# Patient Record
Sex: Female | Born: 1975 | Race: Black or African American | Hispanic: No | Marital: Married | Smoking: Never smoker
Health system: Southern US, Community
[De-identification: ages and names within clinical notes are randomized; demographics above are authoritative.]

## PROBLEM LIST (undated history)

## (undated) DIAGNOSIS — M5020 Other cervical disc displacement, unspecified cervical region: Secondary | ICD-10-CM

## (undated) DIAGNOSIS — I10 Essential (primary) hypertension: Secondary | ICD-10-CM

## (undated) DIAGNOSIS — H05013 Cellulitis of bilateral orbits: Secondary | ICD-10-CM

## (undated) HISTORY — PX: TUBAL LIGATION: SHX77

## (undated) HISTORY — DX: Cellulitis of bilateral orbits: H05.013

## (undated) HISTORY — DX: Other cervical disc displacement, unspecified cervical region: M50.20

---

## 2005-07-23 DIAGNOSIS — I82811 Embolism and thrombosis of superficial veins of right lower extremities: Secondary | ICD-10-CM

## 2005-07-23 HISTORY — DX: Embolism and thrombosis of superficial veins of right lower extremity: I82.811

## 2019-07-24 DIAGNOSIS — U071 COVID-19: Secondary | ICD-10-CM

## 2019-07-24 HISTORY — DX: COVID-19: U07.1

## 2020-01-21 ENCOUNTER — Other Ambulatory Visit: Payer: Self-pay | Admitting: Podiatry

## 2020-01-21 ENCOUNTER — Ambulatory Visit (INDEPENDENT_AMBULATORY_CARE_PROVIDER_SITE_OTHER): Payer: Commercial Managed Care - PPO

## 2020-01-21 ENCOUNTER — Ambulatory Visit (INDEPENDENT_AMBULATORY_CARE_PROVIDER_SITE_OTHER): Payer: Commercial Managed Care - PPO | Admitting: Podiatry

## 2020-01-21 ENCOUNTER — Encounter: Payer: Self-pay | Admitting: Podiatry

## 2020-01-21 ENCOUNTER — Other Ambulatory Visit: Payer: Self-pay

## 2020-01-21 VITALS — Temp 97.9°F

## 2020-01-21 DIAGNOSIS — M76822 Posterior tibial tendinitis, left leg: Secondary | ICD-10-CM

## 2020-01-21 DIAGNOSIS — M79672 Pain in left foot: Secondary | ICD-10-CM

## 2020-01-21 DIAGNOSIS — M25572 Pain in left ankle and joints of left foot: Secondary | ICD-10-CM | POA: Diagnosis not present

## 2020-01-21 MED ORDER — DICLOFENAC SODIUM 75 MG PO TBEC: 75.0000 mg | DELAYED_RELEASE_TABLET | Freq: Two times a day (BID) | ORAL | 2 refills | Status: DC

## 2020-01-21 NOTE — Progress Notes (Signed)
   Subjective:    Patient ID: Kathryn Duncan, female    DOB: [birth year], 44 y.o.   MRN: [id number]  HPI    Review of Systems  All other systems reviewed and are negative.      Objective:   Physical Exam        Assessment & Plan:

## 2020-01-22 NOTE — Progress Notes (Signed)
Subjective:   Patient ID: Kathryn Duncan, female   DOB: 44 y.o.   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  States is been going on for around 1 year does not remember injury   Review of Systems  All other systems reviewed and are negative.       Objective:  Physical Exam Vitals and nursing note reviewed.  Constitutional:      Appearance: She is well-developed.  Pulmonary:     Effort: Pulmonary effort is normal.  Musculoskeletal:        General: Normal range of motion.  Skin:    General: Skin is warm.  Neurological:     Mental Status: She is alert.     Neurovascular status intact muscle strength found to be adequate range of motion within normal limits.  Patient is noted to have discomfort on the inside of the left ankle with inflammation fluid buildup around the posterior tib tendon as it inserts navicular with no current indications of rupture or swelling of the tendon itself.  Local dorsal pain with no significant changes but may be due to compensation and gait.  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  Advised on ice therapy supportive shoe if cannot wear the boot and will be seen back again in 3 weeks or earlier if necessary  X-rays indicate there is moderate flattening of the arch and patient would benefit from orthotic treatment

## 2020-02-11 ENCOUNTER — Other Ambulatory Visit: Payer: Self-pay

## 2020-02-11 ENCOUNTER — Ambulatory Visit: Payer: Commercial Managed Care - PPO | Admitting: Podiatry

## 2020-02-11 ENCOUNTER — Encounter: Payer: Self-pay | Admitting: Podiatry

## 2020-02-11 VITALS — Temp 97.2°F

## 2020-02-11 DIAGNOSIS — M76822 Posterior tibial tendinitis, left leg: Secondary | ICD-10-CM

## 2020-02-11 DIAGNOSIS — M76821 Posterior tibial tendinitis, right leg: Secondary | ICD-10-CM | POA: Diagnosis not present

## 2020-02-15 NOTE — Progress Notes (Signed)
Subjective:   Patient ID: Kathryn Duncan, female   DOB: 44 y.o.   MRN: [id number]   HPI Patient presents stating she is still getting pain in her left ankle and states that she feels like she needs more support.  Patient has had some improvement in oral medication is helping   ROS      Objective:  Physical Exam  Inflammation pain of the posterior tibial insertion left with moderate depression of the arch but no indication of tendon damage or rupture     Assessment:  Chronic posterior tibial tendinitis left with structure of the foot part of the complicating factor     Plan:  H&P reviewed condition and at this point I have recommended orthotics to lift up the arch and try to take pressure off the foot.  Patient also will utilize aggressive ice therapy and continue with oral medications topical medicines.  Patient casted today for functional orthotic devices

## 2020-02-29 ENCOUNTER — Ambulatory Visit: Payer: Commercial Managed Care - PPO | Admitting: Orthotics

## 2020-02-29 ENCOUNTER — Other Ambulatory Visit: Payer: Self-pay

## 2020-02-29 DIAGNOSIS — M25572 Pain in left ankle and joints of left foot: Secondary | ICD-10-CM

## 2020-02-29 DIAGNOSIS — M76822 Posterior tibial tendinitis, left leg: Secondary | ICD-10-CM

## 2020-02-29 NOTE — Progress Notes (Signed)
Patient came in today to pick up custom made foot orthotics.  The goals were accomplished and the patient reported no dissatisfaction with said orthotics.  Patient was advised of breakin period and how to report any issues. 

## 2020-03-01 ENCOUNTER — Encounter: Payer: Commercial Managed Care - PPO | Admitting: Orthotics

## 2020-04-14 ENCOUNTER — Telehealth: Payer: Self-pay | Admitting: *Deleted

## 2020-04-14 ENCOUNTER — Other Ambulatory Visit: Payer: Self-pay | Admitting: Podiatry

## 2020-04-14 MED ORDER — METHYLPREDNISOLONE 4 MG PO TBPK: ORAL_TABLET | ORAL | 0 refills | Status: DC

## 2020-04-14 NOTE — Telephone Encounter (Signed)
Rx sent to pharmacy for steroid.  Cced to [employer]

## 2020-04-14 NOTE — Progress Notes (Unsigned)
Rx Medrol pack. Pt to d/c Voltaren

## 2020-04-14 NOTE — Telephone Encounter (Signed)
Patient called requesting prescription - she states she doesn't have an appointment with Dr. Paulla Dolly for another 2 weeks and would like something to help with her foot and ankle pain. "I am having pitting edema +3 and a lot of pain"  Please advise

## 2020-04-25 ENCOUNTER — Other Ambulatory Visit: Payer: Self-pay

## 2020-04-25 ENCOUNTER — Encounter: Payer: Self-pay | Admitting: Podiatry

## 2020-04-25 ENCOUNTER — Ambulatory Visit (INDEPENDENT_AMBULATORY_CARE_PROVIDER_SITE_OTHER): Payer: Commercial Managed Care - PPO | Admitting: Podiatry

## 2020-04-25 DIAGNOSIS — M76822 Posterior tibial tendinitis, left leg: Secondary | ICD-10-CM

## 2020-04-25 DIAGNOSIS — T148XXA Other injury of unspecified body region, initial encounter: Secondary | ICD-10-CM

## 2020-04-27 ENCOUNTER — Other Ambulatory Visit: Payer: Self-pay | Admitting: Podiatry

## 2020-04-28 NOTE — Progress Notes (Signed)
Subjective:   Patient ID: Kathryn Duncan, female   DOB: 44 y.o.   MRN: [id number]   HPI Patient states she seems to be gradually improving but she still feels like she needs a period of rest to try to get over the top and does seem to do better when she can wear her boot   ROS      Objective:  Physical Exam  Neurovascular status intact with diminishment of discomfort in the left foot with pain still present upon deep palpation but better than it was previously the patient continues to experience discomfort with ambulation     Assessment:  Continues to experience discomfort consistent with fasciitis tendinitis-like condition     Plan:  I reviewed the gradual increase in work at this time and patient may take this week off in order to rest but then will return to work with boot for the next several weeks.  I discussed continuation of stretching exercises shoe gear modification and if symptoms persist return will be seen back.  Most likely had some kind of a traumatic bruise on the left foot but it appears to be resolving currently

## 2020-05-01 ENCOUNTER — Ambulatory Visit
Admission: RE | Admit: 2020-05-01 | Discharge: 2020-05-01 | Disposition: A | Discharge status: Home / Self Care | Payer: Commercial Managed Care - PPO | Source: Ambulatory Visit | Attending: Podiatry | Admitting: Podiatry

## 2020-05-01 DIAGNOSIS — T148XXA Other injury of unspecified body region, initial encounter: Secondary | ICD-10-CM

## 2020-05-01 MED ORDER — GADOBENATE DIMEGLUMINE 529 MG/ML IV SOLN
20.0000 mL | Freq: Once | INTRAVENOUS | Status: AC | PRN
  Administered 2020-05-01: 20 mL via INTRAVENOUS

## 2020-05-06 ENCOUNTER — Other Ambulatory Visit: Payer: Self-pay

## 2020-05-06 ENCOUNTER — Ambulatory Visit: Payer: Commercial Managed Care - PPO | Admitting: Podiatry

## 2020-05-06 ENCOUNTER — Ambulatory Visit (INDEPENDENT_AMBULATORY_CARE_PROVIDER_SITE_OTHER): Payer: Commercial Managed Care - PPO

## 2020-05-06 ENCOUNTER — Encounter: Payer: Self-pay | Admitting: Podiatry

## 2020-05-06 DIAGNOSIS — T148XXA Other injury of unspecified body region, initial encounter: Secondary | ICD-10-CM

## 2020-05-06 DIAGNOSIS — M76822 Posterior tibial tendinitis, left leg: Secondary | ICD-10-CM | POA: Diagnosis not present

## 2020-05-10 NOTE — Progress Notes (Signed)
Subjective:   Patient ID: Kathryn Duncan, female   DOB: 44 y.o.   MRN: [id number]   HPI Patient presents stating she has been wearing her boot as best she can and states that when she wears it she seems to have less symptoms but she still is having pain on the inside of the left ankle   ROS      Objective:  Physical Exam  Neurovascular status intact with continued pain in the posterior tibial tendon left as it comes under the medial malleolus inserts into the navicular with moderate depression of the arch noted with no indication that the tendon is dysfunctional     Assessment:  Continue possibility that there is this chronic inflammation and there may be a subtle tear not picked up on MRI as far as the posterior tibial tendon goes with the possibility that this is the pathology     Plan:  H&P reviewed condition and I have recommended that at this point we try to wean her out of the boot and that if symptoms persist or get a need to consider surgical intervention with exploration of the posterior tibial tendon with possible Kidner procedure.  At this point I educated her on all of this I did x-ray her foot again to try to ascertain any other pathology that may be present  X-rays indicate that there is moderate depression of the arch with no indications of continued breakdown of the arch and I did not note accessory navicular currently signed this

## 2020-06-23 ENCOUNTER — Ambulatory Visit: Payer: Commercial Managed Care - PPO | Admitting: Podiatry

## 2020-06-30 ENCOUNTER — Other Ambulatory Visit: Payer: Self-pay

## 2020-06-30 ENCOUNTER — Ambulatory Visit: Payer: Commercial Managed Care - PPO | Admitting: Podiatry

## 2020-06-30 ENCOUNTER — Encounter: Payer: Self-pay | Admitting: Podiatry

## 2020-06-30 DIAGNOSIS — T148XXA Other injury of unspecified body region, initial encounter: Secondary | ICD-10-CM | POA: Diagnosis not present

## 2020-06-30 DIAGNOSIS — R600 Localized edema: Secondary | ICD-10-CM

## 2020-06-30 DIAGNOSIS — M76822 Posterior tibial tendinitis, left leg: Secondary | ICD-10-CM | POA: Diagnosis not present

## 2020-06-30 NOTE — Progress Notes (Signed)
Subjective:   Patient ID: Kathryn Duncan, female   DOB: 44 y.o.   MRN: [id number]   HPI Patient states that she does get pain but the pain seems to come and go and she is getting swelling in her ankle.  States that she has been reasonably active but still having problems   ROS      Objective:  Physical Exam  Neurovascular status intact with pain still around the posterior tibial tendon left but not intense with mild swelling around the area swelling more in the lateral side of the ankle nondescript with negative Bevelyn Buckles' sign noted     Assessment:  Difficult to say whether or not there may be a small tear posterior tibial tendon versus a synovitis-like condition moderate flatfoot deformity and swelling of the ankle     Plan:  H&P condition reviewed and at this point I reviewed the results of the MRI and discussed that it slightly ambiguous as to whether or not there is a tear but we will continue to treat clinically with orthotics possible injections with ultimate possible exploration of the area Kidner procedure.  At this point I also went ahead dispensed ankle compression stocking to try to see if we can take some of the pressure off the foot and I advised this patient on topical medicines oral medicines as needed and supportive shoe gear usage

## 2020-08-19 ENCOUNTER — Encounter: Payer: Self-pay | Admitting: Obstetrics and Gynecology

## 2020-08-19 ENCOUNTER — Ambulatory Visit: Payer: Commercial Managed Care - PPO | Admitting: Obstetrics and Gynecology

## 2020-08-19 ENCOUNTER — Other Ambulatory Visit (HOSPITAL_COMMUNITY)
Admission: RE | Admit: 2020-08-19 | Discharge: 2020-08-19 | Disposition: A | Discharge status: Home / Self Care | Payer: Commercial Managed Care - PPO | Source: Ambulatory Visit | Attending: Obstetrics and Gynecology | Admitting: Obstetrics and Gynecology

## 2020-08-19 ENCOUNTER — Other Ambulatory Visit: Payer: Self-pay

## 2020-08-19 VITALS — BP 144/80 | HR 104 | Resp 14 | Ht 66.0 in | Wt 248.0 lb

## 2020-08-19 DIAGNOSIS — R350 Frequency of micturition: Secondary | ICD-10-CM

## 2020-08-19 DIAGNOSIS — R3915 Urgency of urination: Secondary | ICD-10-CM

## 2020-08-19 DIAGNOSIS — N3946 Mixed incontinence: Secondary | ICD-10-CM

## 2020-08-19 DIAGNOSIS — N921 Excessive and frequent menstruation with irregular cycle: Secondary | ICD-10-CM

## 2020-08-19 DIAGNOSIS — Z01419 Encounter for gynecological examination (general) (routine) without abnormal findings: Secondary | ICD-10-CM | POA: Insufficient documentation

## 2020-08-19 NOTE — Progress Notes (Signed)
45 y.o. G48P4  Married [place] American female here for annual exam.    Bleeding heavy for 9 - 14 days with her cycle.  Is clotting.  Feeling weak and lightheaded sometimes.  Has low back pain with bleeding.  Ibuprofen sometimes works and sometimes not.  Midol does not help.   Considering hysterectomy.  Cannot take birth control due to a superficial leg thrombosis.  Not interested in an IUD.   Having bladder control problems.  Having to repeat her voids 30 min after she has already gone.  Difficulty getting to the bathroom on time.  Ditropan did not help and she had dry mouth.  Leaks with cough, laugh, sneeze, squatting.  Kegel's not helping.  Wearing a pad all the time.   Declines future childbearing.   Labs done 2 months ago, and she had low iron, vit D.  Normal thyroid function  Seeing orthopedist for bulging disc C7 and C8, tendinosis.   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   Urine dip - RBCs many, 0 - 5 WBC, 6 - 10 squams.   PCP:   Raechel Chute, MD  Patient's last menstrual period was 08/10/2020.     Period Duration (Days): 9-14 Period Pattern: (!) Irregular Menstrual Flow: Light,Moderate,Heavy Menstrual Control: Maxi pad Menstrual Control Change Freq (Hours): varies with flow, on heavy days 2 hours Dysmenorrhea: (!) Moderate Dysmenorrhea Symptoms: Other (Comment),Nausea (lower back pai, fatigue)     Sexually active: Yes.    The current method of family planning is tubal ligation.    Exercising: Yes.    The patient has a physically strenuous job, but has no regular exercise apart from work.  Smoker:  no  Health Maintenance: Pap:  ?2020 normal per patient History of abnormal Pap:  Yes, had colposcopy but no treatment.  MMG:  Fall 2020 normal per patient in [place], [place]:  UTD  Gardasil:   no HIV: screened during pregnancy  Hep C: never Screening Labs:  Hb today: PCP, Urine today: pending   reports that she has never smoked. She has never used  smokeless tobacco. She reports previous alcohol use. She reports that she does not use drugs.  Past Medical History:  Diagnosis Date  . Superficial thrombosis of leg, right 2007    Past Surgical History:  Procedure Laterality Date  . TUBAL LIGATION      Current Outpatient Medications  Medication Sig Dispense Refill  . Ascorbic Acid (VITAMIN C PO) Take 1 tablet by mouth daily.    . Cyanocobalamin (VITAMIN B 12 PO) Take 1 tablet by mouth daily.    Marland Kitchen gabapentin (NEURONTIN) 300 MG capsule Take 300 mg by mouth daily.    . meloxicam (MOBIC) 15 MG tablet Take 15 mg by mouth daily.    . Multiple Vitamins-Minerals (ZINC PO) Take 1 tablet by mouth daily.    . Vitamin D, Ergocalciferol, (DRISDOL) 1.25 MG (50000 UNIT) CAPS capsule Take 50,000 Units by mouth once a week.     No current facility-administered medications for this visit.    Family History  Problem Relation Age of Onset  . Breast cancer Mother   . Diabetes Mother   . Hypertension Mother   . Hyperlipidemia Mother   . Hypertension Father   . Hyperlipidemia Father   . Diabetes Father   . Heart disease Father   . Rheum arthritis Father   . Hypertension Sister   . Breast cancer Paternal Aunt     Review of Systems  Genitourinary: Positive for frequency and urgency.       Heavy, prolonged periods  All other systems reviewed and are negative.   Exam:   BP (!) 144/80 (BP Location: Left Arm, Patient Position: Sitting, Cuff Size: Large)   Pulse (!) 104   Resp 14   Ht 5\' 6"  (1.676 m)   Wt 248 lb (112.5 kg)   LMP 08/10/2020   BMI 40.03 kg/m     General appearance: alert, cooperative and appears stated age Head: normocephalic, without obvious abnormality, atraumatic Neck: no adenopathy, supple, symmetrical, trachea midline and thyroid normal to inspection and palpation Lungs: clear to auscultation bilaterally Breasts: normal appearance, no masses or tenderness, No nipple retraction or dimpling, No nipple discharge or  bleeding, No axillary adenopathy Heart: regular rate and rhythm Abdomen: soft, non-tender; no masses, no organomegaly Extremities: extremities normal, atraumatic, no cyanosis or edema Skin: skin color, texture, turgor normal. No rashes or lesions Lymph nodes: cervical, supraclavicular, and axillary nodes normal. Neurologic: grossly normal  Pelvic: External genitalia:  no lesions              No abnormal inguinal nodes palpated.              Urethra:  normal appearing urethra with no masses, tenderness or lesions              Bartholins and Skenes: normal                 Vagina: normal appearing vagina with normal color and discharge, no lesions              Cervix: no lesions              Pap taken: Yes.   Bimanual Exam:  Uterus:  normal size, contour, position, consistency, mobility, non-tender              Adnexa: no mass, fullness, tenderness              Rectal exam: Yes.  .  Confirms.              Anus:  normal sphincter tone, no lesions  Chaperone was present for exam.  Assessment:   Well woman visit with normal exam. Status post BTL. FH breast cancer. Hx superficial thrombus right leg post partum.   Mixed incontinence.  Menorrhagia with irregular bleeding.   Plan: Mammogram screening discussed.  She will schedule at facility in [place].  Self breast awareness reviewed. Pap and HR HPV as above. Guidelines for Calcium, Vitamin D, regular exercise program including cardiovascular and weight bearing exercise. Get copy of recent labs.  Return for pelvic ultrasound and EMB.  Procedures explained.  We discussed urinary incontinence - overactive bladder and stress incontinence.  I would recommend urodynamic testing if surgical treatment of stress incontinence is pursued.   Follow up annually and prn.

## 2020-08-19 NOTE — Patient Instructions (Addendum)
EXERCISE AND DIET:  We recommended that you start or continue a regular exercise program for good health. Regular exercise means any activity that makes your heart beat faster and makes you sweat.  We recommend exercising at least 30 minutes per day at least 3 days a week, preferably 4 or 5.  We also recommend a diet low in fat and sugar.  Inactivity, poor dietary choices and obesity can cause diabetes, heart attack, stroke, and kidney damage, among others.    ALCOHOL AND SMOKING:  Women should limit their alcohol intake to no more than 7 drinks/beers/glasses of wine (combined, not each!) per week. Moderation of alcohol intake to this level decreases your risk of breast cancer and liver damage. And of course, no recreational drugs are part of a healthy lifestyle.  And absolutely no smoking or even second hand smoke. Most people know smoking can cause heart and lung diseases, but did you know it also contributes to weakening of your bones? Aging of your skin?  Yellowing of your teeth and nails?  CALCIUM AND VITAMIN D:  Adequate intake of calcium and Vitamin D are recommended.  The recommendations for exact amounts of these supplements seem to change often, but generally speaking 600 mg of calcium (either carbonate or citrate) and 800 units of Vitamin D per day seems prudent. Certain women may benefit from higher intake of Vitamin D.  If you are among these women, your doctor will have told you during your visit.    PAP SMEARS:  Pap smears, to check for cervical cancer or precancers,  have traditionally been done yearly, although recent scientific advances have shown that most women can have pap smears less often.  However, every woman still should have a physical exam from her gynecologist every year. It will include a breast check, inspection of the vulva and vagina to check for abnormal growths or skin changes, a visual exam of the cervix, and then an exam to evaluate the size and shape of the uterus and  ovaries.  And after 43 years of age, a rectal exam is indicated to check for rectal cancers. We will also provide age appropriate advice regarding health maintenance, like when you should have certain vaccines, screening for sexually transmitted diseases, bone density testing, colonoscopy, mammograms, etc.   MAMMOGRAMS:  All women over 36 years old should have a yearly mammogram. Many facilities now offer a "3D" mammogram, which may cost around $50 extra out of pocket. If possible,  we recommend you accept the option to have the 3D mammogram performed.  It both reduces the number of women who will be called back for extra views which then turn out to be normal, and it is better than the routine mammogram at detecting truly abnormal areas.  You may do this at the [facility] or [place].   COLONOSCOPY:  Colonoscopy to screen for colon cancer is recommended for all women at age 26.  We know, you hate the idea of the prep.  We agree, BUT, having colon cancer and not knowing it is worse!!  Colon cancer so often starts as a polyp that can be seen and removed at colonscopy, which can quite literally save your life!  And if your first colonoscopy is normal and you have no family history of colon cancer, most women don't have to have it again for 10 years.  Once every ten years, you can do something that may end up saving your life, right?  We will be happy  to help you get it scheduled when you are ready.  Be sure to check your insurance coverage so you understand how much it will cost.  It may be covered as a preventative service at no cost, but you should check your particular policy.      Endometrial Biopsy  An endometrial biopsy is a procedure to remove tissue samples from the endometrium, which is the lining of the uterus. The tissue that is removed can then be checked under a microscope for disease. This procedure is used to diagnose conditions such as endometrial cancer, endometrial tuberculosis, polyps, or  other inflammatory conditions. This procedure may also be used to investigate uterine bleeding to determine where you are in your menstrual cycle or how your hormone levels are affecting the lining of the uterus. Tell a health care provider about:  Any allergies you have.  All medicines you are taking, including vitamins, herbs, eye drops, creams, and over-the-counter medicines.  Any problems you or family members have had with anesthetic medicines.  Any blood disorders you have.  Any surgeries you have had.  Any medical conditions you have.  Whether you are pregnant or may be pregnant. What are the risks? Generally, this is a safe procedure. However, problems may occur, including:  Bleeding.  Pelvic infection.  Puncture of the wall of the uterus with the biopsy device (rare).  Allergic reactions to medicines. What happens before the procedure?  Keep a record of your menstrual cycles as told by your health care provider. You may need to schedule your procedure for a specific time in your cycle.  You may want to bring a sanitary pad to wear after the procedure.  Plan to have someone take you home from the hospital or clinic.  Ask your health care provider about: ? Changing or stopping your regular medicines. This is especially important if you are taking diabetes medicines, arthritis medicines, or blood thinners. ? Taking medicines such as aspirin and ibuprofen. These medicines can thin your blood. Do not take these medicines unless your health care provider tells you to take them. ? Taking over-the-counter medicines, vitamins, herbs, and supplements. What happens during the procedure?  You will lie on an exam table with your feet and legs supported as in a pelvic exam.  Your health care provider will insert an instrument (speculum) into your vagina to see your cervix.  Your cervix will be cleansed with an antiseptic solution.  A medicine (local anesthetic) will be used  to numb the cervix.  A forceps instrument (tenaculum) will be used to hold your cervix steady for the biopsy.  A thin, rod-like instrument (uterine sound) will be inserted through your cervix to determine the length of your uterus and the location where the biopsy sample will be removed.  A thin, flexible tube (catheter) will be inserted through your cervix and into the uterus. The catheter will be used to collect the biopsy sample from your endometrial tissue.  The catheter and speculum will then be removed, and the tissue sample will be sent to a lab for examination. The procedure may vary among health care providers and hospitals. What can I expect after procedure?  You will rest in a recovery area until you are ready to go home.  You may have mild cramping and a small amount of vaginal bleeding. This is normal.  You may have a small amount of vaginal bleeding for a few days. This is normal.  It is up to you to get  the results of your procedure. Ask your health care provider, or the department that is doing the procedure, when your results will be ready. Follow these instructions at home:  Take over-the-counter and prescription medicines only as told by your health care provider.  Do not douche, use tampons, or have sexual intercourse until your health care provider approves.  Return to your normal activities as told by your health care provider. Ask your health care provider what activities are safe for you.  Follow instructions from your health care provider about any activity restrictions, such as restrictions on strenuous exercise or heavy lifting.  Keep all follow-up visits. This is important. Contact a health care provider:  You have heavy bleeding, or bleed for longer than 2 days after the procedure.  You have bad smelling discharge from your vagina.  You have a fever or chills.  You have a burning sensation when urinating or you have difficulty urinating.  You have  severe pain in your lower abdomen. Get help right away if you:  You have severe cramps in your stomach or back.  You pass large blood clots.  Your bleeding increases.  You become weak or light-headed, or you faint or lose consciousness. Summary  An endometrial biopsy is a procedure to remove tissue samples is taken from the endometrium, which is the lining of the uterus.  The tissue sample that is removed will be checked under a microscope for disease.  This procedure is used to diagnose conditions such as endometrial cancer, endometrial tuberculosis, polyps, or other inflammatory conditions.  After the procedure, it is common to have mild cramping and a small amount of vaginal bleeding for a few days.  Do not douche, use tampons, or have sexual intercourse until your health care provider approves. Ask your health care provider which activities are safe for you. This information is not intended to replace advice given to you by your health care provider. Make sure you discuss any questions you have with your health care provider. Document Revised: 02/01/2020 Document Reviewed: 02/01/2020 Elsevier Patient Education  2021 [place].  Urinary Incontinence  Urinary incontinence refers to a condition in which a person is unable to control where and when to pass urine. A person with this condition will urinate when he or she does not mean to (involuntarily). What are the causes? This condition may be caused by:  Medicines.  Infections.  Constipation.  Overactive bladder muscles.  Weak bladder muscles.  Weak pelvic floor muscles. These muscles provide support for the bladder, intestine, and, in women, the uterus.  Enlarged prostate in men. The prostate is a gland near the bladder. When it gets too big, it can pinch the urethra. With the urethra blocked, the bladder can weaken and lose the ability to empty properly.  Surgery.  Emotional factors, such as anxiety, stress, or  post-traumatic stress disorder (PTSD).  Pelvic organ prolapse. This happens in women when organs shift out of place and into the vagina. This shift can prevent the bladder and urethra from working properly. What increases the risk? The following factors may make you more likely to develop this condition:  Older age.  Obesity and physical inactivity.  Pregnancy and childbirth.  Menopause.  Diseases that affect the nerves or spinal cord (neurological diseases).  Long-term (chronic) coughing. This can increase pressure on the bladder and pelvic floor muscles. What are the signs or symptoms? Symptoms may vary depending on the type of urinary incontinence you have. They include:  A  sudden urge to urinate, but passing urine involuntarily before you can get to a bathroom (urge incontinence).  Suddenly passing urine with any activity that forces urine to pass, such as coughing, laughing, exercise, or sneezing (stress incontinence).  Needing to urinate often, but urinating only a small amount, or constantly dribbling urine (overflow incontinence).  Urinating because you cannot get to the bathroom in time due to a physical disability, such as arthritis or injury, or communication and thinking problems, such as Alzheimer disease (functional incontinence). How is this diagnosed? This condition may be diagnosed based on:  Your medical history.  A physical exam.  Tests, such as: ? Urine tests. ? X-rays of your kidney and bladder. ? Ultrasound. ? CT scan. ? Cystoscopy. In this procedure, a health care provider inserts a tube with a light and camera (cystoscope) through the urethra and into the bladder in order to check for problems. ? Urodynamic testing. These tests assess how well the bladder, urethra, and sphincter can store and release urine. There are different types of urodynamic tests, and they vary depending on what the test is measuring. To help diagnose your condition, your health  care provider may recommend that you keep a log of when you urinate and how much you urinate. How is this treated? Treatment for this condition depends on the type of incontinence that you have and its cause. Treatment may include:  Lifestyle changes, such as: ? Quitting smoking. ? Maintaining a healthy weight. ? Staying active. Try to get 150 minutes of moderate-intensity exercise every week. Ask your health care provider which activities are safe for you. ? Eating a healthy diet.  Avoid high-fat foods, like fried foods.  Avoid refined carbohydrates like white bread and white rice.  Limit how much alcohol and caffeine you drink.  Increase your fiber intake. Foods such as fresh fruits, vegetables, beans, and whole grains are healthy sources of fiber.  Pelvic floor muscle exercises.  Bladder training, such as lengthening the amount of time between bathroom breaks, or using the bathroom at regular intervals.  Using techniques to suppress bladder urges. This can include distraction techniques or controlled breathing exercises.  Medicines to relax the bladder muscles and prevent bladder spasms.  Medicines to help slow or prevent the growth of a man's prostate.  Botox injections. These can help relax the bladder muscles.  Using pulses of electricity to help change bladder reflexes (electrical nerve stimulation).  For women, using a medical device to prevent urine leaks. This is a small, tampon-like, disposable device that is inserted into the urethra.  Injecting collagen or carbon beads (bulking agents) into the urinary sphincter. These can help thicken tissue and close the bladder opening.  Surgery. Follow these instructions at home: Lifestyle  Limit alcohol and caffeine. These can fill your bladder quickly and irritate it.  Keep yourself clean to help prevent odors and skin damage. Ask your doctor about special skin creams and cleansers that can protect the skin from  urine.  Consider wearing pads or adult diapers. Make sure to change them regularly, and always change them right after experiencing incontinence. General instructions  Take over-the-counter and prescription medicines only as told by your health care provider.  Use the bathroom about every 3-4 hours, even if you do not feel the need to urinate. Try to empty your bladder completely every time. After urinating, wait a minute. Then try to urinate again.  Make sure you are in a relaxed position while urinating.  If your incontinence  is caused by nerve problems, keep a log of the medicines you take and the times you go to the bathroom.  Keep all follow-up visits as told by your health care provider. This is important. Contact a health care provider if:  You have pain that gets worse.  Your incontinence gets worse. Get help right away if:  You have a fever or chills.  You are unable to urinate.  You have redness in your groin area or down your legs. Summary  Urinary incontinence refers to a condition in which a person is unable to control where and when to pass urine.  This condition may be caused by medicines, infection, weak bladder muscles, weak pelvic floor muscles, enlargement of the prostate (in men), or surgery.  The following factors increase your risk for developing this condition: older age, obesity, pregnancy and childbirth, menopause, neurological diseases, and chronic coughing.  There are several types of urinary incontinence. They include urge incontinence, stress incontinence, overflow incontinence, and functional incontinence.  This condition is usually treated first with lifestyle and behavioral changes, such as quitting smoking, eating a healthier diet, and doing regular pelvic floor exercises. Other treatment options include medicines, bulking agents, medical devices, electrical nerve stimulation, or surgery. This information is not intended to replace advice given to  you by your health care provider. Make sure you discuss any questions you have with your health care provider. Document Revised: 07/19/2017 Document Reviewed: 10/18/2016 Elsevier Patient Education  [place].

## 2020-08-21 LAB — CULTURE INDICATED

## 2020-08-21 LAB — URINALYSIS, COMPLETE W/RFL CULTURE
Bilirubin Urine: NEGATIVE
Glucose, UA: NEGATIVE
Hyaline Cast: NONE SEEN /LPF
Ketones, ur: NEGATIVE
Leukocyte Esterase: NEGATIVE
Nitrites, Initial: NEGATIVE
RBC / HPF: >=60 /HPF — AB (ref 0–2)
Specific Gravity, Urine: 1.015 (ref 1.001–1.03)
pH: 6.5 (ref 5.0–8.0)

## 2020-08-21 LAB — URINE CULTURE
MICRO NUMBER:: 11469917
SPECIMEN QUALITY:: ADEQUATE

## 2020-08-22 LAB — CYTOLOGY - PAP
Comment: NEGATIVE
Diagnosis: NEGATIVE
High risk HPV: NEGATIVE

## 2020-09-26 ENCOUNTER — Other Ambulatory Visit: Payer: Self-pay

## 2020-09-26 ENCOUNTER — Telehealth: Payer: Self-pay

## 2020-09-26 DIAGNOSIS — N920 Excessive and frequent menstruation with regular cycle: Secondary | ICD-10-CM

## 2020-09-26 NOTE — Telephone Encounter (Signed)
Patient called in voice mail stating that she was in at end of Jan and never heard back regarding follow up that was recommended. Upon review of her chart I see Dr. Quincy Simmonds rec u/s and endo bx. U/s order placed-endo bx already in.  I called patient and left message asking if this was what she was referring to and letting her know I will assume it was and someone from the appointment desk will be in touch soon to schedule. A staff message was sent to Alora requesting she call patient to schedule.

## 2020-10-24 ENCOUNTER — Telehealth: Payer: Self-pay

## 2020-10-24 NOTE — Telephone Encounter (Signed)
Spoke with patient regarding benefits for scheduled Ultrasound and Endometrial Biopsy. Patient acknowledges understanding of information presented. Encounter closed.

## 2020-11-03 ENCOUNTER — Ambulatory Visit (INDEPENDENT_AMBULATORY_CARE_PROVIDER_SITE_OTHER): Payer: Commercial Managed Care - PPO | Admitting: Obstetrics and Gynecology

## 2020-11-03 ENCOUNTER — Other Ambulatory Visit: Payer: Self-pay

## 2020-11-03 ENCOUNTER — Other Ambulatory Visit (HOSPITAL_COMMUNITY)
Admission: RE | Admit: 2020-11-03 | Discharge: 2020-11-03 | Disposition: A | Discharge status: Home / Self Care | Payer: Commercial Managed Care - PPO | Source: Ambulatory Visit | Attending: Obstetrics and Gynecology | Admitting: Obstetrics and Gynecology

## 2020-11-03 ENCOUNTER — Ambulatory Visit (INDEPENDENT_AMBULATORY_CARE_PROVIDER_SITE_OTHER): Payer: Commercial Managed Care - PPO

## 2020-11-03 ENCOUNTER — Encounter: Payer: Self-pay | Admitting: Obstetrics and Gynecology

## 2020-11-03 VITALS — BP 132/78 | HR 88 | Ht 66.0 in | Wt 248.0 lb

## 2020-11-03 DIAGNOSIS — N3946 Mixed incontinence: Secondary | ICD-10-CM | POA: Diagnosis not present

## 2020-11-03 DIAGNOSIS — N921 Excessive and frequent menstruation with irregular cycle: Secondary | ICD-10-CM | POA: Insufficient documentation

## 2020-11-03 DIAGNOSIS — N920 Excessive and frequent menstruation with regular cycle: Secondary | ICD-10-CM | POA: Diagnosis not present

## 2020-11-03 NOTE — Patient Instructions (Addendum)
ENDOMETRIAL BIOPSY POST-PROCEDURE INSTRUCTIONS  1. You may take Ibuprofen, Aleve or Tylenol for pain if needed.  Cramping should resolve within in 24 hours.  2. You may have a small amount of spotting.  You should wear a mini pad for the next few days.  3. You may have intercourse after 24 hours.  4. You need to call if you have any pelvic pain, fever, heavy bleeding or foul smelling vaginal discharge.  5. Shower or bathe as normal  6. We will call you within one week with results or we will discuss   the results at your follow-up appointment if needed.  Urodynamic Testing  Urodynamic tests are done to determine how well your lower urinary tract is working. The lower urinary tract includes your bladder and the part of your body that drains urine from the bladder (urethra). When your kidneys filter your blood, urine is stored in your bladder until you feel the urge to urinate. Urination requires coordination between the nerves and muscles of your bladder and urethra. When your lower urinary tract is working well, you should be able to:  Start urinating when your bladder is full.  Empty your bladder completely.  Control the flow of your urine. Why do I need urodynamic testing? You may need urodynamic testing to help find the cause of any of these problems:  Leaking urine (incontinence).  Problems starting or stopping your urine flow.  Frequent or painful urination.  Frequent urinary tract infections.  Being unable to empty your bladder completely.  Having strong urges to pass urine (urgency).  Having a weak flow of urine. What are the risks? Generally, these tests are safe. However, some of the tests have risks, including:  Discomfort.  Frequent urge to urinate.  Bleeding.  Infection.  Allergic reactions to medicines or dyes (contrast material). What happens before the test?  Ask your health care provider about changing or stopping your regular medicines. This is  especially important if you are taking diabetes medicines or blood thinners.  You may be asked to avoid urinating before coming to the test so that you arrive with a full bladder.  Tell a health care provider about: ? Any allergies you have. ? All medicines you are taking, including vitamins, herbs, eye drops, creams, and over-the-counter medicines. ? Whether you are pregnant or may be pregnant. What happens during the test? You may have various urodynamic tests. The tests may be done separately or may all be done during one visit. You may be given an antibiotic medicine before or after testing to help prevent infection. The types of tests that may be done include: Uroflowmetry This test measures how much urine you pass and how long it takes to pass.  You will urinate into a certain type of toilet or device (flowmeter).  The device will measure the volume and the time of your urine flow.  These measurements will be sent to a computer that creates a graph of your urine flow. Postvoid residual measurement This test measures how much urine is left in your bladder after you urinate.  The test may be done with ultrasound. In this method, sound waves and a computer will be used to create an image of your bladder.  The test can also be done by inserting a thin, flexible tube (catheter) into your bladder after you urinate. The remaining urine will be removed through the catheter so it can be measured.  Remaining urine will be measured in milliliters (mL). If you  have more than 100 mL left in your bladder after you urinate, your bladder is not emptying as it should. Cystometric testing This test uses a type of bladder catheter that can measure pressure.  You may be given a medicine to numb the area (local anesthetic).  The area around the opening of your urethra will be cleaned.  A urinary catheter will be passed through your urethra into your bladder and used to empty your bladder  completely.  A measuring catheter will be placed, and your bladder will be filled with warm, germ-free (sterile) water.  Pressure measurements will be taken: ? As your bladder fills. ? When you feel the need to urinate. ? As your bladder is emptied.  You may be asked to cough or bear down to check for leakage.  In some cases, your bladder may be filled with a material that shows up on X-rays (contrast material) so that X-ray pictures can be taken during the test. Electromyogram This test measures the electrical activity of the nerves and muscles of your bladder and the opening of your urethra.  Sticky patches (electrodes) will be placed near your rectum and urethra to measure electrical activity.  The measurements will show how well your nerves are communicating with your muscles. What can I expect after the test?  You should be able to go home right away and do your usual activities.  You may be told to drink a glass of water every 30 minutes for the first 2 hours after testing.  Taking a warm bath or using warm, wet cloths (warm compresses) may relieve any discomfort near your urethra. What do the results mean? Talk with your health care provider about what your results mean. Some common causes for abnormal results from urodynamic tests include:  Enlarged prostate in men.  Overactive bladder.  Urinary tract infection.  Nervous system diseases.  Spinal cord damage. Questions to ask your health care provider Ask your health care provider, or the department that is doing the test:  When will my results be ready?  How will I get my results?  What are my treatment options?  What other tests do I need?  What are my next steps? Contact a health care provider if:  You have pain.  You have blood in your urine.  You have chills.  You have a fever. Summary  Urodynamic tests are done to determine how well your lower urinary tract is working. The lower urinary tract  includes your bladder and urethra.  You may need urodynamic testing to help find the cause of various problems with urination, such as leaking urine (incontinence) or problems starting or stopping your urine flow.  You may have various urodynamic tests. The tests may be done separately or may all be done during one testing visit.  Talk with your health care provider about what your results mean.  Contact your health care provider if you have pain, chills, a fever, or blood in your urine. This information is not intended to replace advice given to you by your health care provider. Make sure you discuss any questions you have with your health care provider. Document Revised: 02/12/2020 Document Reviewed: 02/12/2020 Elsevier Patient Education  2021 [place].   Total Laparoscopic Hysterectomy A total laparoscopic hysterectomy is a minimally invasive surgery to remove the uterus and cervix. The fallopian tubes and ovaries can also be removed during this surgery, if necessary. This procedure may be done to treat problems such as:  Growths in  the uterus (uterine fibroids) that are not cancer but cause symptoms.  A condition that causes the lining of the uterus to grow in other areas (endometriosis).  Problems with pelvic support.  Cancer of the cervix, ovaries, uterus, or tissue that lines the uterus (endometrium).  Excessive bleeding in the uterus. After this procedure, you will no longer be able to have a baby, and you will no longer have a menstrual period. Tell a health care provider about:  Any allergies you have.  All medicines you are taking, including vitamins, herbs, eye drops, creams, and over-the-counter medicines.  Any problems you or family members have had with anesthetic medicines.  Any blood disorders you have.  Any surgeries you have had.  Any medical conditions you have.  Whether you are pregnant or may be pregnant. What are the risks? Generally, this is a  safe procedure. However, problems may occur, including:  Infection.  Bleeding.  Blood clots in the legs or lungs.  Allergic reactions to medicines.  Damage to nearby structures or organs.  Having to change from this surgery to one in which a large incision is made in the abdomen (abdominal hysterectomy). What happens before the procedure? Staying hydrated Follow instructions from your health care provider about hydration, which may include:  Up to 2 hours before the procedure - you may continue to drink clear liquids, such as water, clear fruit juice, black coffee, and plain tea.   Eating and drinking restrictions Follow instructions from your health care provider about eating and drinking, which may include:  8 hours before the procedure - stop eating heavy meals or foods, such as meat, fried foods, or fatty foods.  6 hours before the procedure - stop eating light meals or foods, such as toast or cereal.  6 hours before the procedure - stop drinking milk or drinks that contain milk.  2 hours before the procedure - stop drinking clear liquids. Medicines  Ask your health care provider about: ? Changing or stopping your regular medicines. This is especially important if you are taking diabetes medicines or blood thinners. ? Taking medicines such as aspirin and ibuprofen. These medicines can thin your blood. Do not take these medicines unless your health care provider tells you to take them. ? Taking over-the-counter medicines, vitamins, herbs, and supplements.  You may be asked to take medicine that helps you have a bowel movement (laxative) to prevent constipation. General instructions  If you were asked to do bowel preparation before the procedure, follow instructions from your health care provider.  This procedure can affect the way you feel about yourself. Talk with your health care provider about the physical and emotional changes hysterectomy may cause.  Do not use any  products that contain nicotine or tobacco for at least 4 weeks before the procedure. These products include cigarettes, chewing tobacco, and vaping devices, such as e-cigarettes. If you need help quitting, ask your health care provider.  Plan to have a responsible adult take you home from the hospital or clinic.  Plan to have a responsible adult care for you for the time you are told after you leave the hospital or clinic. This is important. Surgery safety Ask your health care provider:  How your surgery site will be marked.  What steps will be taken to help prevent infection. These may include: ? Removing hair at the surgery site. ? Washing skin with a germ-killing soap. ? Receiving antibiotic medicine. What happens during the procedure?  An IV will  be inserted into one of your veins.  You will be given one or more of the following: ? A medicine to help you relax (sedative). ? A medicine to make you fall asleep (general anesthetic). ? A medicine to numb the area (local anesthetic). ? A medicine that is injected into your spine to numb the area below and slightly above the injection site (spinal anesthetic). ? A medicine that is injected into an area of your body to numb everything below the injection site (regional anesthetic).  A gas will be used to inflate your abdomen. This will allow your surgeon to look inside your abdomen and do the surgery.  Three or four small incisions will be made in your abdomen.  A small device with a light (laparoscope) will be inserted into one of your incisions. Surgical instruments will be inserted through the other incisions in order to perform the procedure.  Your uterus and cervix may be removed through your vagina or cut into small pieces and removed through the small incisions. Any other organs that need to be removed will also be removed this way.  The gas will be released from inside your abdomen.  Your incisions will be closed with stitches  (sutures), skin glue, or adhesive strips.  A bandage (dressing) may be placed over your incisions. The procedure may vary among health care providers and hospitals. What happens after the procedure?  Your blood pressure, heart rate, breathing rate, and blood oxygen level will be monitored until you leave the hospital or clinic.  You will be given medicine for pain as needed.  You will be encouraged to walk as soon as possible. You will also use a device to help you breathe or do breathing exercises to keep your lungs clear.  You may have to wear compression stockings. These stockings help to prevent blood clots and reduce swelling in your legs.  You will need to wear a sanitary pad for vaginal discharge or bleeding. Summary  Total laparoscopic hysterectomy is a procedure to remove your uterus, cervix, and sometimes the fallopian tubes and ovaries.  This procedure can affect the way you feel about yourself. Talk with your health care provider about the physical and emotional changes hysterectomy may cause.  After this procedure, you will no longer be able to have a baby, and you will no longer have a menstrual period.  You will be given pain medicine to control discomfort after this procedure.  Plan to have a responsible adult take you home from the hospital or clinic. This information is not intended to replace advice given to you by your health care provider. Make sure you discuss any questions you have with your health care provider. Document Revised: 03/11/2020 Document Reviewed: 03/11/2020 Elsevier Patient Education  [place].

## 2020-11-03 NOTE — Progress Notes (Signed)
GYNECOLOGY  VISIT   HPI: 45 y.o.   Married  [place]  female   No obstetric history on file. with Patient's last menstrual period was 10/17/2020 (exact date).   here for pelvic ultrasound and EMB.     Patient has menorrhagia with irregular cycles.  Menses heavy for 9 - 14 days.   Had menses twice last month.  3/6 - 3/9 and then again 3/28 and lasted 7 days.   Has lower back pain with cycles.   Declines future childbearing. Interested in hysterectomy.   Having bad night sweats and is changing her gown.  Has generalized itching of skin.   Would like treatment for urinary stress incontinence.  Leaks with sneeze, cough, and laugh.  Wearing pads all the time.  GYNECOLOGIC HISTORY: Patient's last menstrual period was 10/17/2020 (exact date). Contraception: Tubal Menopausal hormone therapy: none Last mammogram: 04/2019 normal per patient in [place] Last pap smear: 08-19-20 Neg:Neg HR HPV, ?2020 normal per patient in [place]        OB History   No obstetric history on file.        There are no problems to display for this patient.   Past Medical History:  Diagnosis Date  . Superficial thrombosis of leg, right 2007    Past Surgical History:  Procedure Laterality Date  . TUBAL LIGATION      Current Outpatient Medications  Medication Sig Dispense Refill  . Ascorbic Acid (VITAMIN C PO) Take 1 tablet by mouth daily.    . Cyanocobalamin (VITAMIN B 12 PO) Take 1 tablet by mouth daily.    . meloxicam (MOBIC) 15 MG tablet Take 15 mg by mouth daily.    . Multiple Vitamins-Minerals (ZINC PO) Take 1 tablet by mouth daily.    . Vitamin D, Ergocalciferol, (DRISDOL) 1.25 MG (50000 UNIT) CAPS capsule Take 50,000 Units by mouth once a week.     No current facility-administered medications for this visit.     ALLERGIES: Clindamycin/lincomycin and Penicillins  Family History  Problem Relation Age of Onset  . Breast cancer Mother   . Diabetes Mother   . Hypertension  Mother   . Hyperlipidemia Mother   . Hypertension Father   . Hyperlipidemia Father   . Diabetes Father   . Heart disease Father   . Rheum arthritis Father   . Hypertension Sister   . Breast cancer Paternal Aunt     Social History   Socioeconomic History  . Marital status: Married    Spouse name: Not on file  . Number of children: Not on file  . Years of education: Not on file  . Highest education level: Not on file  Occupational History  . Not on file  Tobacco Use  . Smoking status: Never Smoker  . Smokeless tobacco: Never Used  Vaping Use  . Vaping Use: Never used  Substance and Sexual Activity  . Alcohol use: Not Currently  . Drug use: Never  . Sexual activity: Yes    Birth control/protection: Surgical    Comment: BTL  Other Topics Concern  . Not on file  Social History Narrative  . Not on file   Social Determinants of Health   Financial Resource Strain: Not on file  Food Insecurity: Not on file  Transportation Needs: Not on file  Physical Activity: Not on file  Stress: Not on file  Social Connections: Not on file  Intimate Partner Violence: Not on file    Review of Systems  All other  systems reviewed and are negative.   PHYSICAL EXAMINATION:    BP 132/78 (Cuff Size: Large)   Pulse 88   Ht 5\' 6"  (1.676 m)   Wt 248 lb (112.5 kg)   LMP 10/17/2020 (Exact Date)   SpO2 98%   BMI 40.03 kg/m     General appearance: alert, cooperative and appears stated age  Pelvic: External genitalia:  no lesions              Urethra:  normal appearing urethra with no masses, tenderness or lesions              Bartholins and Skenes: normal                 Vagina: normal appearing vagina with normal color and discharge, no lesions              Cervix: no lesions                Bimanual Exam:  Uterus:  normal size, contour, position, consistency, mobility, non-tender              Adnexa: no mass, fullness, tenderness                Pelvic US  Uterus with 12.70 mm  endometrial stripe, symmetrical and trilayered.  Two intramural fibroids, 1.79 and 1.73 cm.  Ovaries normal.  No adnexal masses.  No free fluid.   EMB Consent for procedure. Hibiclens prep.  Tenaculum to anterior cervical lip.  Paracervical block 10 cc 1% lidocaine, lot [id number], exp 05/2022.  EMB to 8 cm x 2. Tissue to pathology.  No complications.  Minimal EBL.   Chaperone was present for exam.  ASSESSMENT    Menorrhagia with irregular bleeding. Small fibroids.  I suspect anovulatory bleeding.  Mixed incontinence.  Status post BTL. FH breast cancer. Hx superficial thrombus right leg post partum.    PLAN  Pelvic US findings and images reviewed. FU EMB. Will need to get a copy of her recent labs or repeat them at her urodynamic visit.  Plan for urodynamic testing.  Procedure and rationale explained.  We will work toward laparoscopic hysterectomy with bilateral salpingectomy and potential midurethral sling/cystoscopy.    33 mi  total time was spent for this patient encounter, including preparation, face-to-face counseling with the patient, coordination of care, and documentation of the encounter.

## 2020-11-07 LAB — SURGICAL PATHOLOGY

## 2020-11-09 ENCOUNTER — Telehealth: Payer: Self-pay

## 2020-11-09 NOTE — Telephone Encounter (Signed)
Per Dr. Elza Rafter result note: "She needs an appointment for urodynamic testing as she is working toward surgical care: total laparoscopic hysterectomy, bilateral salpingectomy, and potential midurethral sling.   She has menorrhagia with irregular bleeding, fibroids, and mixed incontinence. "

## 2020-11-10 NOTE — Telephone Encounter (Signed)
Left message to call Jonie Burdell, RN at [facility], [phone].  

## 2020-11-14 NOTE — Telephone Encounter (Signed)
Spoke with patient regarding benefits for recommended Urodynamic Profile Study. Patient acknowledges understanding of information presented. Patient is ready to proceed with scheduling appointment.  Spoke with patient regarding surgery benefits. Patient acknowledges understanding of information presented. Patient is aware that benefits presented are professional benefits only. Patient is aware that once surgery is scheduled, the hospital will call with separate benefits. See account note.  Routing to Glorianne Manchester, Therapist, sports, for Urodynamic Profile Study scheduling and surgery scheduling.

## 2020-11-14 NOTE — Telephone Encounter (Signed)
Left message to call Kwan Shellhammer, RN at [facility], [phone].  

## 2020-11-16 NOTE — Telephone Encounter (Signed)
Spoke with patient.  Offered urodynamics testing on 5/9 and 5/17, patient declined. She will be our of town on 5/9 and has class on 5/17. Advised patient I can f/u with her when the June schedule is available. Advised if she decides one of these dates work for her, return call to office.  Patient verbalizes understanding and is agreeable.   Will keep patients surgery form for f/u.   Routing to Dr. Antony Blackbird.   Cc: Hayley Carder

## 2020-11-25 ENCOUNTER — Telehealth: Payer: Self-pay | Admitting: *Deleted

## 2020-11-25 NOTE — Telephone Encounter (Signed)
Pt complains of lower back pain, groin pain with possible  knot/mass. Patient states she will make an appt with Dr. Quincy Simmonds for evaluation.

## 2020-11-28 ENCOUNTER — Encounter: Payer: Self-pay | Admitting: Obstetrics and Gynecology

## 2020-11-28 ENCOUNTER — Ambulatory Visit: Payer: Commercial Managed Care - PPO | Admitting: Obstetrics and Gynecology

## 2020-11-28 ENCOUNTER — Other Ambulatory Visit: Payer: Self-pay

## 2020-11-28 VITALS — BP 122/70 | HR 83 | Ht 66.0 in | Wt 258.0 lb

## 2020-11-28 DIAGNOSIS — N764 Abscess of vulva: Secondary | ICD-10-CM | POA: Diagnosis not present

## 2020-11-28 MED ORDER — FLUCONAZOLE 150 MG PO TABS: 150.0000 mg | ORAL_TABLET | Freq: Once | ORAL | 0 refills | Status: AC

## 2020-11-28 MED ORDER — SULFAMETHOXAZOLE-TRIMETHOPRIM 800-160 MG PO TABS: 1.0000 | ORAL_TABLET | Freq: Two times a day (BID) | ORAL | 0 refills | Status: DC

## 2020-11-28 NOTE — Progress Notes (Signed)
GYNECOLOGY  VISIT   HPI: 45 y.o.   Married  [place] American  female   No obstetric history on file. with Patient's last menstrual period was 11/26/2020 (exact date).   here for back pain and groin pain. She does have an area draining in right groin--tender to touch.  Pain started at work 5 days ago.  She noticed the knot in the groin the next day.  This did drain.   No muscle strain.   Patient denies any urinary symptoms. Denies dysuria or hematuria.  Hx renal stone, passed on its own.   Menses 27-Nov-2022 - 5/2.  Usually has back pain with her cycle.   Last BM yesterday.  Denies nausea, vomiting, diarrhea.  Has 2 small fibroids, each under 2 cm.   GYNECOLOGIC HISTORY: Patient's last menstrual period was 11/26/20 (exact date). Contraception:  Tubal Menopausal hormone therapy:  none Last mammogram:  04/2020 normal per patient in [place] Last pap smear: 08-19-20 Neg:Neg HR HPV, ?2020 normal per patient in [place]        OB History   No obstetric history on file.        There are no problems to display for this patient.   Past Medical History:  Diagnosis Date  . Superficial thrombosis of leg, right 2007    Past Surgical History:  Procedure Laterality Date  . TUBAL LIGATION      Current Outpatient Medications  Medication Sig Dispense Refill  . Ascorbic Acid (VITAMIN C PO) Take 1 tablet by mouth daily.    . Cyanocobalamin (VITAMIN B 12 PO) Take 1 tablet by mouth daily.    . meloxicam (MOBIC) 15 MG tablet Take 15 mg by mouth daily.    . Multiple Vitamins-Minerals (ZINC PO) Take 1 tablet by mouth daily.    . Vitamin D, Ergocalciferol, (DRISDOL) 1.25 MG (50000 UNIT) CAPS capsule Take 50,000 Units by mouth once a week.     No current facility-administered medications for this visit.     ALLERGIES: Clindamycin/lincomycin and Penicillins  Family History  Problem Relation Age of Onset  . Breast cancer Mother   . Diabetes Mother   . Hypertension Mother   .  Hyperlipidemia Mother   . Hypertension Father   . Hyperlipidemia Father   . Diabetes Father   . Heart disease Father   . Rheum arthritis Father   . Hypertension Sister   . Breast cancer Paternal Aunt     Social History   Socioeconomic History  . Marital status: Married    Spouse name: Not on file  . Number of children: Not on file  . Years of education: Not on file  . Highest education level: Not on file  Occupational History  . Not on file  Tobacco Use  . Smoking status: Never Smoker  . Smokeless tobacco: Never Used  Vaping Use  . Vaping Use: Never used  Substance and Sexual Activity  . Alcohol use: Not Currently  . Drug use: Never  . Sexual activity: Yes    Birth control/protection: Surgical    Comment: BTL  Other Topics Concern  . Not on file  Social History Narrative  . Not on file   Social Determinants of Health   Financial Resource Strain: Not on file  Food Insecurity: Not on file  Transportation Needs: Not on file  Physical Activity: Not on file  Stress: Not on file  Social Connections: Not on file  Intimate Partner Violence: Not on file    Review of  Systems  Genitourinary: Positive for pelvic pain (pt.has raised area in Rt.groin which is draining.).  All other systems reviewed and are negative.   PHYSICAL EXAMINATION:    BP 122/70 (Cuff Size: Large)   Pulse 83   Ht 5\' 6"  (1.676 m)   Wt 258 lb (117 kg)   LMP 11/16/2020 (Exact Date)   SpO2 99%   BMI 41.64 kg/m     General appearance: alert, cooperative and appears stated age Back:  No CVA tenderness. Abdomen: soft, non-tender, no masses,  no organomegaly Right inguinal region with 2.5 cm tender subcutaneous mass. No drainage noted.  Left inguinal region with no mass or tenderness. Pelvic: External genitalia:  no lesions              Urethra:  normal appearing urethra with no masses, tenderness or lesions              Bartholins and Skenes: normal                 Vagina: normal appearing  vagina with normal color and discharge, no lesions              Cervix: no lesions                Bimanual Exam:  Uterus:  normal size, contour, position, consistency, mobility, non-tender              Adnexa: no mass, fullness, tenderness              Chaperone was present for exam.  ASSESSMENT  Vulvar abscess versus infected lymph node. Back pain.  PLAN  No I and D performed today.  Start Bactrim DS po bid x 7 days.  Diflucan 150 mg po x 1 if needed for yeast infection.  May repeat in 72 hours prn.  FU for a recheck in 10 days.  I recommend she go to an ER if the infections worsens as she may need to see a Education officer, environmental.    20 min total time was spent for this patient encounter, including preparation, face-to-face counseling with the patient, coordination of care, and documentation of the encounter.

## 2020-11-28 NOTE — Patient Instructions (Signed)
Skin Abscess  A skin abscess is an infected area on or under your skin that contains a collection of pus and other material. An abscess may also be called a furuncle, carbuncle, or boil. An abscess can occur in or on almost any part of your body. Some abscesses break open (rupture) on their own. Most continue to get worse unless they are treated. The infection can spread deeper into the body and eventually into your blood, which can make you feel ill. Treatment usually involves draining the abscess. What are the causes? An abscess occurs when germs, like bacteria, pass through your skin and cause an infection. This may be caused by:  A scrape or cut on your skin.  A puncture wound through your skin, including a needle injection or insect bite.  Blocked oil or sweat glands.  Blocked and infected hair follicles.  A cyst that forms beneath your skin (sebaceous cyst) and becomes infected. What increases the risk? This condition is more likely to develop in people who:  Have a weak body defense system (immune system).  Have diabetes.  Have dry and irritated skin.  Get frequent injections or use illegal IV drugs.  Have a foreign body in a wound, such as a splinter.  Have problems with their lymph system or veins. What are the signs or symptoms? Symptoms of this condition include:  A painful, firm bump under the skin.  A bump with pus at the top. This may break through the skin and drain. Other symptoms include:  Redness surrounding the abscess site.  Warmth.  Swelling of the lymph nodes (glands) near the abscess.  Tenderness.  A sore on the skin. How is this diagnosed? This condition may be diagnosed based on:  A physical exam.  Your medical history.  A sample of pus. This may be used to find out what is causing the infection.  Blood tests.  Imaging tests, such as an ultrasound, CT scan, or MRI. How is this treated? A small abscess that drains on its own may not  need treatment. Treatment for larger abscesses may include:  Moist heat or heat pack applied to the area several times a day.  A procedure to drain the abscess (incision and drainage).  Antibiotic medicines. For a severe abscess, you may first get antibiotics through an IV and then change to antibiotics by mouth. Follow these instructions at home: Medicines  Take over-the-counter and prescription medicines only as told by your health care provider.  If you were prescribed an antibiotic medicine, take it as told by your health care provider. Do not stop taking the antibiotic even if you start to feel better.   Abscess care  If you have an abscess that has not drained, apply heat to the affected area. Use the heat source that your health care provider recommends, such as a moist heat pack or a heating pad. ? Place a towel between your skin and the heat source. ? Leave the heat on for 20-30 minutes. ? Remove the heat if your skin turns bright red. This is especially important if you are unable to feel pain, heat, or cold. You may have a greater risk of getting burned.  Follow instructions from your health care provider about how to take care of your abscess. Make sure you: ? Cover the abscess with a bandage (dressing). ? Change your dressing or gauze as told by your health care provider. ? Wash your hands with soap and water before you change the  dressing or gauze. If soap and water are not available, use hand sanitizer.  Check your abscess every day for signs of a worsening infection. Check for: ? More redness, swelling, or pain. ? More fluid or blood. ? Warmth. ? More pus or a bad smell.   General instructions  To avoid spreading the infection: ? Do not share personal care items, towels, or hot tubs with others. ? Avoid making skin contact with other people.  Keep all follow-up visits as told by your health care provider. This is important. Contact a health care provider if you  have:  More redness, swelling, or pain around your abscess.  More fluid or blood coming from your abscess.  Warm skin around your abscess.  More pus or a bad smell coming from your abscess.  A fever.  Muscle aches.  Chills or a general ill feeling. Get help right away if you:  Have severe pain.  See red streaks on your skin spreading away from the abscess. Summary  A skin abscess is an infected area on or under your skin that contains a collection of pus and other material.  A small abscess that drains on its own may not need treatment.  Treatment for larger abscesses may include having a procedure to drain the abscess and taking an antibiotic. This information is not intended to replace advice given to you by your health care provider. Make sure you discuss any questions you have with your health care provider. Document Revised: 10/30/2018 Document Reviewed: 08/22/2017 Elsevier Patient Education  2021 [employer].

## 2020-11-29 ENCOUNTER — Telehealth: Payer: Self-pay | Admitting: *Deleted

## 2020-11-29 DIAGNOSIS — N3946 Mixed incontinence: Secondary | ICD-10-CM

## 2020-11-29 NOTE — Telephone Encounter (Signed)
Patient left message on voicemail on 11/28/20 requesting return call.   Routing to triage for return call.

## 2020-11-29 NOTE — Telephone Encounter (Signed)
I spoke with patient today.  She had called yesterday to inquire if urodynamics could be performed at same time as problem visit.  She had previously spoken with Sharee Pimple and been offered that date but at that time could not do it.  I apologized that it is too late to consider that. She knows Sharee Pimple will be calling her as soon as she gets the June schedule.

## 2020-12-05 NOTE — Telephone Encounter (Signed)
Call returned to patient. Left message advising next available dates for urodynamics testing 6/6 or 6/7.  Return call to office to further discuss.

## 2020-12-06 NOTE — Telephone Encounter (Signed)
Patient returned call and left message requesting to schedule urodynamics testing on 12/26/20.   Call returned to patient. Left detailed message, ok per dpr.  Advised patient urodynamics testing scheduled for 0900 on 12/26/20, will send further instructions by mail to address on file. Arrive at [facility].   Return call to office to schedule lab appt on 6/1 for UA.   Consult scheduled for 01/06/21 at 0800 w/ Dr. Quincy Simmonds.   Return call to office to confirm.   Order previously placed by Dr. Quincy Simmonds for precert.  Routing to [employer] and [employer]

## 2020-12-06 NOTE — Progress Notes (Deleted)
GYNECOLOGY  VISIT   HPI: 45 y.o.   Married  [place] American  female   No obstetric history on file. with Patient's last menstrual period was 11/16/2020 (exact date).   here for follow up.     GYNECOLOGIC HISTORY: Patient's last menstrual period was 11/16/2020 (exact date). Contraception: Tubal Menopausal hormone therapy:  none Last mammogram: 04/2020 normal per patient in [place] Last pap smear: 08-19-20 Neg:Neg HR HPV, ?2020 normal per patient in [place]        OB History   No obstetric history on file.        There are no problems to display for this patient.   Past Medical History:  Diagnosis Date  . Superficial thrombosis of leg, right 2007    Past Surgical History:  Procedure Laterality Date  . TUBAL LIGATION      Current Outpatient Medications  Medication Sig Dispense Refill  . Ascorbic Acid (VITAMIN C PO) Take 1 tablet by mouth daily.    . Cyanocobalamin (VITAMIN B 12 PO) Take 1 tablet by mouth daily.    . meloxicam (MOBIC) 15 MG tablet Take 15 mg by mouth daily.    . Multiple Vitamins-Minerals (ZINC PO) Take 1 tablet by mouth daily.    Marland Kitchen sulfamethoxazole-trimethoprim (BACTRIM DS) 800-160 MG tablet Take 1 tablet by mouth 2 (two) times daily. Take for 7 days. 14 tablet 0  . Vitamin D, Ergocalciferol, (DRISDOL) 1.25 MG (50000 UNIT) CAPS capsule Take 50,000 Units by mouth once a week.     No current facility-administered medications for this visit.     ALLERGIES: Clindamycin/lincomycin and Penicillins  Family History  Problem Relation Age of Onset  . Breast cancer Mother   . Diabetes Mother   . Hypertension Mother   . Hyperlipidemia Mother   . Hypertension Father   . Hyperlipidemia Father   . Diabetes Father   . Heart disease Father   . Rheum arthritis Father   . Hypertension Sister   . Breast cancer Paternal Aunt     Social History   Socioeconomic History  . Marital status: Married    Spouse name: Not on file  . Number of children: Not on file   . Years of education: Not on file  . Highest education level: Not on file  Occupational History  . Not on file  Tobacco Use  . Smoking status: Never Smoker  . Smokeless tobacco: Never Used  Vaping Use  . Vaping Use: Never used  Substance and Sexual Activity  . Alcohol use: Not Currently  . Drug use: Never  . Sexual activity: Yes    Birth control/protection: Surgical    Comment: BTL  Other Topics Concern  . Not on file  Social History Narrative  . Not on file   Social Determinants of Health   Financial Resource Strain: Not on file  Food Insecurity: Not on file  Transportation Needs: Not on file  Physical Activity: Not on file  Stress: Not on file  Social Connections: Not on file  Intimate Partner Violence: Not on file    Review of Systems  PHYSICAL EXAMINATION:    LMP 11/16/2020 (Exact Date)     General appearance: alert, cooperative and appears stated age Head: Normocephalic, without obvious abnormality, atraumatic Neck: no adenopathy, supple, symmetrical, trachea midline and thyroid normal to inspection and palpation Lungs: clear to auscultation bilaterally Breasts: normal appearance, no masses or tenderness, No nipple retraction or dimpling, No nipple discharge or bleeding, No axillary or supraclavicular adenopathy  Heart: regular rate and rhythm Abdomen: soft, non-tender, no masses,  no organomegaly Extremities: extremities normal, atraumatic, no cyanosis or edema Skin: Skin color, texture, turgor normal. No rashes or lesions Lymph nodes: Cervical, supraclavicular, and axillary nodes normal. No abnormal inguinal nodes palpated Neurologic: Grossly normal  Pelvic: External genitalia:  no lesions              Urethra:  normal appearing urethra with no masses, tenderness or lesions              Bartholins and Skenes: normal                 Vagina: normal appearing vagina with normal color and discharge, no lesions              Cervix: no lesions                 Bimanual Exam:  Uterus:  normal size, contour, position, consistency, mobility, non-tender              Adnexa: no mass, fullness, tenderness              Rectal exam: {yes no:314532}.  Confirms.              Anus:  normal sphincter tone, no lesions  Chaperone was present for exam.  ASSESSMENT     PLAN     An After Visit Summary was printed and given to the patient.  ______ minutes face to face time of which over 50% was spent in counseling.

## 2020-12-08 ENCOUNTER — Ambulatory Visit: Payer: Commercial Managed Care - PPO | Admitting: Obstetrics and Gynecology

## 2020-12-08 NOTE — Telephone Encounter (Signed)
Call returned to patient, left detailed message.  Per review of Epic you are scheduled for UA on 12/21/20 at 0830.  Urodynamics testing on 6/6 at 0900, arriving at [facility].  I will mail out additional instructions for urodynamics testing.  Return call to office if any additional questions.   Routing to provider for final review. Patient is agreeable to disposition. Will close encounter.  Cc: Vickii Chafe for benefits

## 2020-12-21 ENCOUNTER — Other Ambulatory Visit: Payer: Self-pay

## 2020-12-21 ENCOUNTER — Other Ambulatory Visit: Payer: Commercial Managed Care - PPO

## 2020-12-21 DIAGNOSIS — N3946 Mixed incontinence: Secondary | ICD-10-CM

## 2020-12-22 LAB — URINALYSIS, COMPLETE W/RFL CULTURE
Bacteria, UA: NONE SEEN /HPF
Bilirubin Urine: NEGATIVE
Glucose, UA: NEGATIVE
Hyaline Cast: NONE SEEN /LPF
Ketones, ur: NEGATIVE
Leukocyte Esterase: NEGATIVE
Nitrites, Initial: NEGATIVE
Protein, ur: NEGATIVE
RBC / HPF: NONE SEEN /HPF (ref 0–2)
Specific Gravity, Urine: 1.014 (ref 1.001–1.035)
Squamous Epithelial / HPF: NONE SEEN /HPF (ref <=5)
WBC, UA: NONE SEEN /HPF (ref 0–5)
pH: 6 (ref 5.0–8.0)

## 2020-12-22 LAB — NO CULTURE INDICATED

## 2020-12-22 NOTE — Telephone Encounter (Addendum)
Spoke with patient regarding benefits for recommended Urodynamics. Patient acknowledges understanding of information presented. No Prior auth needed reference # [id number] See account note

## 2020-12-23 NOTE — Progress Notes (Signed)
GYNECOLOGY  VISIT   HPI: 45 y.o.   Married [place] American female   No obstetric history on file. with No LMP recorded.   here for   urodynamics consult  Leaks urine with sneeze, cough, and laugh.  Wears a pad.   Urodynamic testing done 12/26/20:  showed stress incontinence.  Uroflow:  continuous.  Void 371 cc. PVR 10 cc.  CMG:  S1 143 cc, S2 207 cc, S3 336 cc.  VLPP 102 cm H2O.  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  UPP: 30 cm H2O. Pressure flow:  Pdet max 55 cm H2O.  Voided 353 cc.   She has menorrhagia with irregular cycles, likely due to anovulation.  Her US showed 2 small fibroids, normal ovaries, and her endometrial biopsy was benign.  She also has some back pain.   She wants to do hysterectomy and bladder incontinence surgery this summer.   Lives in [place], [place].   GYNECOLOGIC HISTORY: Patient's last menstrual period was Contraception: Tubal ligation  Menopausal hormone therapy:  No Last mammogram:  04/2020 Normal  Last pap smear:  08-19-20 Neg:Neg HR HPV        OB History   No obstetric history on file.        There are no problems to display for this patient.   Past Medical History:  Diagnosis Date   Superficial thrombosis of leg, right 2007    Past Surgical History:  Procedure Laterality Date   TUBAL LIGATION      Current Outpatient Medications  Medication Sig Dispense Refill   Ascorbic Acid (VITAMIN C PO) Take 1 tablet by mouth daily.     Cyanocobalamin (VITAMIN B 12 PO) Take 1 tablet by mouth daily.     meloxicam (MOBIC) 15 MG tablet Take 15 mg by mouth daily.     Multiple Vitamins-Minerals (ZINC PO) Take 1 tablet by mouth daily.     Vitamin D, Ergocalciferol, (DRISDOL) 1.25 MG (50000 UNIT) CAPS capsule Take 50,000 Units by mouth once a week.     No current facility-administered medications for this visit.     ALLERGIES: Clindamycin/lincomycin and Penicillins  Family History  Problem Relation Age of Onset   Breast cancer Mother     Diabetes Mother    Hypertension Mother    Hyperlipidemia Mother    Hypertension Father    Hyperlipidemia Father    Diabetes Father    Heart disease Father    Rheum arthritis Father    Hypertension Sister    Breast cancer Paternal Aunt     Social History   Socioeconomic History   Marital status: Married    Spouse name: Not on file   Number of children: Not on file   Years of education: Not on file   Highest education level: Not on file  Occupational History   Not on file  Tobacco Use   Smoking status: Former    Pack years: 0.00    Types: Cigarettes    Quit date: 12/31/2001    Years since quitting: 19.0   Smokeless tobacco: Never  Vaping Use   Vaping Use: Never used  Substance and Sexual Activity   Alcohol use: Not Currently   Drug use: Never   Sexual activity: Yes    Birth control/protection: Surgical    Comment: BTL  Other Topics Concern   Not on file  Social History Narrative   Not on file   Social Determinants of Health   Financial Resource Strain: Not on file  Food Insecurity: Not on file  Transportation Needs: Not on file  Physical Activity: Not on file  Stress: Not on file  Social Connections: Not on file  Intimate Partner Violence: Not on file    Review of Systems  See HPI.  PHYSICAL EXAMINATION:    BP 120/80 (BP Location: Right Arm, Patient Position: Sitting, Cuff Size: Large)   Ht 5\' 7"  (1.702 m)   Wt 260 lb (117.9 kg)   BMI 40.72 kg/m     General appearance: alert, cooperative and appears stated age  ASSESSMENT  Urinary stress incontinence.  Menorrhagia with irregular cycles, fibroids.ress urinary incontinence.   PLAN  Urodynamic testing and dx reviewed.  We discussed options for treating stress incontinence:  physical therapy, pessary and urethral slings.  We focused on midurethral sling/cystoscopy.  Unique risks of procedure may include but are not limited to cystotomy, mesh erosion/exposure, urinary retention, slower voiding,  UTIs, and urinary urgency.   Will plan for a total laparoscopic hysterectomy with bilateral salpingectomy, TVT Exact midurethral sling, cystoscopy for diagnoses of menorrhagia with irregular cycles, fibroids, and stress urinary incontinence.   ACOG HO on surgical tx of urinary incontinence.  She has written information on hysterectomy.   Will plan for overnight observation due to distance from home.   37 min total time was spent for this patient encounter, including preparation, face-to-face counseling with the patient, coordination of care, and documentation of the encounter.

## 2020-12-26 ENCOUNTER — Ambulatory Visit (INDEPENDENT_AMBULATORY_CARE_PROVIDER_SITE_OTHER): Payer: Commercial Managed Care - PPO | Admitting: *Deleted

## 2020-12-26 ENCOUNTER — Other Ambulatory Visit: Payer: Self-pay

## 2020-12-26 VITALS — BP 136/78 | HR 90 | Resp 14 | Ht 66.0 in | Wt 250.0 lb

## 2020-12-26 DIAGNOSIS — N3946 Mixed incontinence: Secondary | ICD-10-CM | POA: Diagnosis not present

## 2020-12-26 NOTE — Progress Notes (Addendum)
Kathryn Duncan is a 45 y.o. female Who presents today for urodynamics testing, ordered by Dr. Quincy Simmonds.    Allergies and medications reviewed.  Denies complaints today. No urinary complaints.   Urine Micro exam: negative for WBC's or RBC's, okay to proceed per Dr. Quincy Simmonds.  Patient reports urinary leakage with coughing, sneezing and laughing.   Urodynamics testing initiated. Lumax Bladder Catheter #10 [place] and lumax Abdominal Catheter #10 [place].   Post void residual 10 ml.   Urethral catheter placed without issue. Rectal catheter placed without issue.   Urodynamics testing completed. Please see scanned Patient summary report in Epic. Procedure completed and patient tolerated well without complaints. Patient scheduled for follow up office visit with Dr. Quincy Simmonds to discuss results. Patient agreeable.   Patient given post procedure instructions:  You may have a mild bladder and rectal discomfort for a few hours after the test. You may experience some frequent urination and slight burning the first few times you urinate after the test. Rarely, the urine may be blood tinged. These are both due to catheter placements and resolve quickly. You should call our office immediately if you have signs of infection, which may include bladder pain, urinary urgency, fever, or burning during urination. We do encourage you to drink plenty of water after the test.

## 2021-01-06 ENCOUNTER — Other Ambulatory Visit: Payer: Self-pay

## 2021-01-06 ENCOUNTER — Telehealth: Payer: Self-pay | Admitting: Obstetrics and Gynecology

## 2021-01-06 ENCOUNTER — Ambulatory Visit: Payer: Commercial Managed Care - PPO | Admitting: Obstetrics and Gynecology

## 2021-01-06 ENCOUNTER — Encounter: Payer: Self-pay | Admitting: Obstetrics and Gynecology

## 2021-01-06 VITALS — BP 120/80 | Ht 67.0 in | Wt 260.0 lb

## 2021-01-06 DIAGNOSIS — N393 Stress incontinence (female) (male): Secondary | ICD-10-CM

## 2021-01-06 NOTE — Progress Notes (Signed)
Leaks urine with sneeze, cough, and laugh. Wears a pad.   Urodynamic testing done 12/26/20:  showed stress incontinence.  Uroflow:  continuous.  Void 371 cc. PVR 10 cc. CMG:  S1 143 cc, S2 207 cc, S3 336 cc.  VLPP 102 cm H2O.  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 UPP: 30 cm H2O. Pressure flow:  Pdet max 55 cm H2O.  Voided 353 cc.  She is a candidate for midurethral sling, cystoscopy.

## 2021-01-06 NOTE — Telephone Encounter (Signed)
Please precert and schedule surgery for my patient.  She will have a total laparoscopic hysterectomy with bilateral salpingectomy, TVT Exact midurethral sling, cystoscopy done for diagnoses of menorrhagia with irregular cycles, fibroids, and stress urinary incontinence.   Surgery to be done at [facility].  Time needed:  3.5 hours.  Assistant is needed.   She will need a preop appointment scheduled.

## 2021-01-06 NOTE — Telephone Encounter (Signed)
Spoke with patient. Reviewed surgery dates.  Patient request to proceed with surgery on 02/28/21. Advised patient I will return call once surgery date confirmed. Patient agreeable.   Surgery request sent.   Routing to [employer]

## 2021-01-12 NOTE — Telephone Encounter (Signed)
Spoke with patient regarding surgery benefits. Patient acknowledges understanding of information presented. Patient is aware that benefits presented are professional benefits only. Patient is aware that once surgery is scheduled, the hospital will call with separate benefits. See account note.  Routing to Jill Hamm, RN, for surgery scheduling. 

## 2021-01-12 NOTE — Telephone Encounter (Signed)
Left message to call Raymie Trani, RN at [facility], [phone].  

## 2021-01-16 NOTE — Telephone Encounter (Signed)
Left message to call [name], RN at [facility], [phone].  

## 2021-01-17 NOTE — Telephone Encounter (Signed)
Spoke with patient. Surgery date request confirmed.  Advised surgery is scheduled for 02/28/21, [facility] at 0730.  Surgery instruction sheet and hospital brochure reviewed, printed copy will be mailed.  Pre-op scheduled for 02/03/21 at 4:30pm.  Patient advised of Covid screening and quarantine requirements and agreeable.   Routing to provider. Encounter closed.   Cc: [employer]

## 2021-01-17 NOTE — Telephone Encounter (Signed)
Left message to call Kari Montero, RN at [facility], [phone].  

## 2021-01-30 ENCOUNTER — Telehealth: Payer: Self-pay | Admitting: *Deleted

## 2021-01-30 NOTE — Telephone Encounter (Signed)
Patient left message requesting confirmation that office received FMLA and Short Term Disability forms.   Routing to [employer] for f/u.

## 2021-02-01 NOTE — Telephone Encounter (Signed)
Patient returned call. Requesting return call regarding message below.  Routing to [employer] for f/u.

## 2021-02-01 NOTE — Telephone Encounter (Signed)
Spoke with pt she is aware we have FMLA forms and will be done by due date

## 2021-02-03 ENCOUNTER — Other Ambulatory Visit: Payer: Self-pay

## 2021-02-03 ENCOUNTER — Encounter: Payer: Self-pay | Admitting: Obstetrics and Gynecology

## 2021-02-03 ENCOUNTER — Ambulatory Visit (INDEPENDENT_AMBULATORY_CARE_PROVIDER_SITE_OTHER): Payer: Commercial Managed Care - PPO | Admitting: Obstetrics and Gynecology

## 2021-02-03 VITALS — BP 120/78 | HR 72 | Ht 67.0 in | Wt 258.0 lb

## 2021-02-03 DIAGNOSIS — N393 Stress incontinence (female) (male): Secondary | ICD-10-CM

## 2021-02-03 DIAGNOSIS — D219 Benign neoplasm of connective and other soft tissue, unspecified: Secondary | ICD-10-CM | POA: Diagnosis not present

## 2021-02-03 DIAGNOSIS — N921 Excessive and frequent menstruation with irregular cycle: Secondary | ICD-10-CM | POA: Diagnosis not present

## 2021-02-03 NOTE — Progress Notes (Signed)
GYNECOLOGY  VISIT   HPI: 45 y.o.   Married  [place] American  female   No obstetric history on file. with Patient's last menstrual period was 01/12/2021.   here for  Pre Op check.   Desires surgery for heavy and irregular menses and for urinary incontinence. She wants to do hysterectomy. Declines future childbearing.   Leaks urine with sneeze, cough, and laugh. Wears a pad.   Urodynamic testing done 12/26/20:  showed stress incontinence.  Uroflow:  continuous.  Void 371 cc. PVR 10 cc. CMG:  S1 143 cc, S2 207 cc, S3 336 cc.  VLPP 102 cm H2O.  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 UPP: 30 cm H2O. Pressure flow:  Pdet max 55 cm H2O.  Voided 353 cc.   She has menorrhagia with irregular cycles, likely due to anovulation. Her US showed 2 small fibroids, normal ovaries, and her endometrial biopsy was benign. She also has some back pain.  No changes in her health.   GYNECOLOGIC HISTORY: Patient's last menstrual period was 01/12/2021. Contraception:  BTL Menopausal hormone therapy:  N/A Last mammogram:  Fall 2020-normal.  Thinks it was done last year at [facility], [place].  Last pap smear:   08-11-20 normal        OB History   No obstetric history on file.        Patient Active Problem List   Diagnosis Date Noted   Urinary, incontinence, stress female 01/06/2021    Past Medical History:  Diagnosis Date   Cervical herniated disc    reports pinched nerve also   Superficial thrombosis of leg, right 2007    Past Surgical History:  Procedure Laterality Date   TUBAL LIGATION      Current Outpatient Medications  Medication Sig Dispense Refill   Cyanocobalamin (VITAMIN B 12 PO) Take 1 tablet by mouth daily.     Multiple Vitamins-Minerals (ZINC PO) Take 1 tablet by mouth daily.     Vitamin D, Ergocalciferol, (DRISDOL) 1.25 MG (50000 UNIT) CAPS capsule Take 50,000 Units by mouth once a week.     Ascorbic Acid (VITAMIN C PO) Take 1 tablet by mouth daily.  (Patient not taking: Reported on 02/03/2021)     meloxicam (MOBIC) 15 MG tablet Take 15 mg by mouth daily. (Patient not taking: Reported on 02/03/2021)     No current facility-administered medications for this visit.     ALLERGIES: Clindamycin/lincomycin and Penicillins  Family History  Problem Relation Age of Onset   Breast cancer Mother    Diabetes Mother    Hypertension Mother    Hyperlipidemia Mother    Hypertension Father    Hyperlipidemia Father    Diabetes Father    Heart disease Father    Rheum arthritis Father    Hypertension Sister    Breast cancer Paternal Aunt     Social History   Socioeconomic History   Marital status: Married    Spouse name: Not on file   Number of children: Not on file   Years of education: Not on file   Highest education level: Not on file  Occupational History   Not on file  Tobacco Use   Smoking status: Former    Types: Cigarettes    Quit date: 12/31/2001    Years since quitting: 19.1   Smokeless tobacco: Never  Vaping Use   Vaping Use: Never used  Substance and Sexual Activity   Alcohol use: Not Currently   Drug use: Never  Sexual activity: Yes    Birth control/protection: Surgical    Comment: BTL  Other Topics Concern   Not on file  Social History Narrative   Not on file   Social Determinants of Health   Financial Resource Strain: Not on file  Food Insecurity: Not on file  Transportation Needs: Not on file  Physical Activity: Not on file  Stress: Not on file  Social Connections: Not on file  Intimate Partner Violence: Not on file    Review of Systems  See HPI.  PHYSICAL EXAMINATION:    BP 120/78 (BP Location: Right Arm, Patient Position: Sitting, Cuff Size: Large)   Pulse 72   Ht 5\' 7"  (1.702 m)   Wt 258 lb (117 kg)   LMP 01/12/2021   SpO2 100%   BMI 40.41 kg/m     General appearance: alert, cooperative and appears stated age Head: Normocephalic, without obvious abnormality, atraumatic Neck: no adenopathy,  supple, symmetrical, trachea midline and thyroid normal to inspection and palpation Lungs: clear to auscultation bilaterally Heart: regular rate and rhythm Abdomen: soft, non-tender, no masses,  no organomegaly Extremities: extremities normal, atraumatic, no cyanosis or edema Skin: Skin color, texture, turgor normal. No rashes or lesions Lymph nodes: Cervical, supraclavicular, and axillary nodes normal. No abnormal inguinal nodes palpated Neurologic: Grossly normal  Pelvic: External genitalia:  no lesions              Urethra:  normal appearing urethra with no masses, tenderness or lesions              Bartholins and Skenes: normal                 Vagina: normal appearing vagina with normal color and discharge, no lesions              Cervix: no lesions                Bimanual Exam:  Uterus:  normal size, contour, position, consistency, mobility, non-tender              Adnexa: no mass, fullness, tenderness         Chaperone was present for exam.  ASSESSMENT  Menorrhagia with irregular menses.  Fibroids. Stress urinary incontinence.  Status post BTL. Herniated disc and pinched nerve. Hx superficial thrombosis of right leg.  PLAN  Will plan for a total laparoscopic hysterectomy with bilateral salpingectomy, TVT Exact midurethral sling, cystoscopy.  Will keep ovaries unless are abnormal.  Risks, benefits, and alternatives have been reviewed.  Risks include but are not limited to bleeding, transfusion, infection, damage to surrounding organs, mesh erosion and exposure, urinary retention, urinary urgency, slower voiding, pneumonia, reaction to anesthesia, DVT, PE, death, need for reoperation, hernia formation, need to convert to a traditional laparotomy incision to complete the procedure.  Surgical expectations and recovery discussed.  Patient wishes to proceed.  She will receive Lovenox for DVT/PE prophylaxis.  Questions invited and answered.   27 min total time was spent for  this patient encounter, including preparation, face-to-face counseling with the patient, coordination of care, and documentation of the encounter.

## 2021-02-05 ENCOUNTER — Encounter: Payer: Self-pay | Admitting: Obstetrics and Gynecology

## 2021-02-05 DIAGNOSIS — D219 Benign neoplasm of connective and other soft tissue, unspecified: Secondary | ICD-10-CM | POA: Insufficient documentation

## 2021-02-05 DIAGNOSIS — N921 Excessive and frequent menstruation with irregular cycle: Secondary | ICD-10-CM | POA: Insufficient documentation

## 2021-02-05 NOTE — H&P (Signed)
Office Visit  02/03/2021 [facility], Kathryn All, MD  Obstetrics and Gynecology Menorrhagia with irregular cycle +2 more  Dx Pre op check  Reason for Visit    Additional Documentation  Vitals:  BP 120/78 (BP Location: Right Arm, Patient Position: Sitting, Cuff Size: Large) Pulse 72 Ht 5\' 7"  (1.702 m) Wt 117 kg LMP 01/12/2021 SpO2 100% BMI 40.41 kg/m BSA 2.35 m  More Vitals  Flowsheets:  Anthropometrics, NEWS, MEWS Score   Encounter Info:  Billing Info, History, Allergies, Detailed Report    Orthostatic Vitals Recorded in This Encounter   02/03/2021  1709     Patient Position: Sitting  BP Location: Right Arm  Cuff Size: Large   [facility] Notes    Progress Notes by Nunzio Cobbs, MD at 02/03/2021 4:30 PM  Author: Nunzio Cobbs, MD Author Type: Physician Filed: 02/05/2021 12:49 PM  Note Status: Signed Cosign: Cosign Not Required Encounter Date: 02/03/2021  Editor: Nunzio Cobbs, MD (Physician)      Prior Versions: 1. [name], CMA (Certified Medical Assistant) at 02/03/2021  5:03 PM - Sign when Signing Visit    GYNECOLOGY  VISIT   HPI: 45 y.o.   Married  Kathryn Duncan  female   No obstetric history on file. with Patient's last menstrual period was 01/12/2021.   here for  Pre Op check.   Desires surgery for heavy and irregular menses and for urinary incontinence. She wants to do hysterectomy. Declines future childbearing.    Leaks urine with sneeze, cough, and laugh. Wears a pad.   Urodynamic testing done 12/26/20:  showed stress incontinence.  Uroflow:  continuous.  Void 371 cc. PVR 10 cc. CMG:  S1 143 cc, S2 207 cc, S3 336 cc.  VLPP 102 cm H2O.  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 UPP: 30 cm H2O. Pressure flow:  Pdet max 55 cm H2O.  Voided 353 cc.   She has menorrhagia with irregular cycles, likely due to anovulation. Her US showed 2 small fibroids, normal  ovaries, and her endometrial biopsy was benign. She also has some back pain.   No changes in her health.    GYNECOLOGIC HISTORY: Patient's last menstrual period was 01/12/2021. Contraception:  BTL Menopausal hormone therapy:  N/A Last mammogram:  Fall 2020-normal.  Thinks it was done last year at [facility], [place]. Last pap smear:   08-11-20 normal        OB History   No obstetric history on file.              Patient Active Problem List    Diagnosis Date Noted   Urinary, incontinence, stress female 01/06/2021          Past Medical History:  Diagnosis Date   Cervical herniated disc      reports pinched nerve also   Superficial thrombosis of leg, right 2007           Past Surgical History:  Procedure Laterality Date   TUBAL LIGATION                Current Outpatient Medications  Medication Sig Dispense Refill   Cyanocobalamin (VITAMIN B 12 PO) Take 1 tablet by mouth daily.       Multiple Vitamins-Minerals (ZINC PO) Take 1 tablet by mouth daily.       Vitamin D, Ergocalciferol, (DRISDOL) 1.25 MG (50000 UNIT) CAPS capsule Take 50,000 Units by  mouth once a week.       Ascorbic Acid (VITAMIN C PO) Take 1 tablet by mouth daily. (Patient not taking: Reported on 02/03/2021)       meloxicam (MOBIC) 15 MG tablet Take 15 mg by mouth daily. (Patient not taking: Reported on 02/03/2021)        No current facility-administered medications for this visit.      ALLERGIES: Clindamycin/lincomycin and Penicillins        Family History  Problem Relation Age of Onset   Breast cancer Mother     Diabetes Mother     Hypertension Mother     Hyperlipidemia Mother     Hypertension Father     Hyperlipidemia Father     Diabetes Father     Heart disease Father     Rheum arthritis Father     Hypertension Sister     Breast cancer Paternal Aunt        Social History         Socioeconomic History   Marital status: Married      Spouse name: Not on file   Number of  children: Not on file   Years of education: Not on file   Highest education level: Not on file  Occupational History   Not on file  Tobacco Use   Smoking status: Former      Types: Cigarettes      Quit date: 12/31/2001      Years since quitting: 19.1   Smokeless tobacco: Never  Vaping Use   Vaping Use: Never used  Substance and Sexual Activity   Alcohol use: Not Currently   Drug use: Never   Sexual activity: Yes      Birth control/protection: Surgical      Comment: BTL  Other Topics Concern   Not on file  Social History Narrative   Not on file    Social Determinants of Health    Financial Resource Strain: Not on file  Food Insecurity: Not on file  Transportation Needs: Not on file  Physical Activity: Not on file  Stress: Not on file  Social Connections: Not on file  Intimate Partner Violence: Not on file      Review of Systems  See HPI.   PHYSICAL EXAMINATION:     BP 120/78 (BP Location: Right Arm, Patient Position: Sitting, Cuff Size: Large)   Pulse 72   Ht 5\' 7"  (1.702 m)   Wt 258 lb (117 kg)   LMP 01/12/2021   SpO2 100%   BMI 40.41 kg/m     General appearance: alert, cooperative and appears stated age Head: Normocephalic, without obvious abnormality, atraumatic Neck: no adenopathy, supple, symmetrical, trachea midline and thyroid normal to inspection and palpation Lungs: clear to auscultation bilaterally Heart: regular rate and rhythm Abdomen: soft, non-tender, no masses,  no organomegaly Extremities: extremities normal, atraumatic, no cyanosis or edema Skin: Skin color, texture, turgor normal. No rashes or lesions Lymph nodes: Cervical, supraclavicular, and axillary nodes normal. No abnormal inguinal nodes palpated Neurologic: Grossly normal   Pelvic: External genitalia:  no lesions              Urethra:  normal appearing urethra with no masses, tenderness or lesions              Bartholins and Skenes: normal                 Vagina: normal appearing  vagina with normal color and discharge, no lesions  Cervix: no lesions                Bimanual Exam:  Uterus:  normal size, contour, position, consistency, mobility, non-tender              Adnexa: no mass, fullness, tenderness          Chaperone was present for exam.   ASSESSMENT   Menorrhagia with irregular menses. Fibroids. Stress urinary incontinence. Status post BTL. Herniated disc and pinched nerve. Hx superficial thrombosis of right leg.   PLAN   Will plan for a total laparoscopic hysterectomy with bilateral salpingectomy, TVT Exact midurethral sling, cystoscopy.  Will keep ovaries unless are abnormal.   Risks, benefits, and alternatives have been reviewed.  Risks include but are not limited to bleeding, transfusion, infection, damage to surrounding organs, mesh erosion and exposure, urinary retention, urinary urgency, slower voiding, pneumonia, reaction to anesthesia, DVT, PE, death, need for reoperation, hernia formation, need to convert to a traditional laparotomy incision to complete the procedure.  Surgical expectations and recovery discussed. Patient wishes to proceed.   She will receive Lovenox for DVT/PE prophylaxis.   Questions invited and answered.    27 min total time was spent for this patient encounter, including preparation, face-to-face counseling with the patient, coordination of care, and documentation of the encounter.

## 2021-02-09 ENCOUNTER — Telehealth: Payer: Self-pay | Admitting: *Deleted

## 2021-02-09 NOTE — Telephone Encounter (Signed)
Patient left message requesting return call.  Spoke with patient. Patient is requesting to reschedule surgery currently scheduled for 02/27/21. Patient request to schedule in September 2022. Dates reviewed.   Patient request to proceed with scheduling on 04/03/21. Pre-op scheduled, post-op appts updated. Advised surgery r/s to 04/03/21 at [facility]. Patient verbalizes understanding and is agreeable. She has received surgery instruction by mail. I will notify [facility] PAT. Will forward to business office to update. Patient verbalizes understanding and is agreeable.   [facility] PAT notified of surgery date change.   Routing to provider for final review. Patient is agreeable to disposition. Will close encounter.  Cc: Kimalexis

## 2021-02-09 NOTE — Telephone Encounter (Signed)
Spoke with pt this morning, She would like to speak with Glorianne Manchester RN, to r/s surgery.

## 2021-02-24 ENCOUNTER — Encounter (HOSPITAL_COMMUNITY): Payer: Commercial Managed Care - PPO

## 2021-03-07 ENCOUNTER — Other Ambulatory Visit: Payer: Self-pay

## 2021-03-07 ENCOUNTER — Ambulatory Visit (INDEPENDENT_AMBULATORY_CARE_PROVIDER_SITE_OTHER): Payer: Commercial Managed Care - PPO | Admitting: Obstetrics and Gynecology

## 2021-03-07 ENCOUNTER — Other Ambulatory Visit (HOSPITAL_COMMUNITY)
Admission: RE | Admit: 2021-03-07 | Discharge: 2021-03-07 | Disposition: A | Discharge status: Home / Self Care | Payer: Commercial Managed Care - PPO | Source: Ambulatory Visit | Attending: Obstetrics and Gynecology | Admitting: Obstetrics and Gynecology

## 2021-03-07 ENCOUNTER — Encounter: Payer: Self-pay | Admitting: Obstetrics and Gynecology

## 2021-03-07 ENCOUNTER — Encounter: Payer: Commercial Managed Care - PPO | Admitting: Obstetrics and Gynecology

## 2021-03-07 VITALS — BP 120/60 | HR 63 | Ht 66.0 in | Wt 258.0 lb

## 2021-03-07 DIAGNOSIS — N760 Acute vaginitis: Secondary | ICD-10-CM | POA: Diagnosis present

## 2021-03-07 DIAGNOSIS — N393 Stress incontinence (female) (male): Secondary | ICD-10-CM | POA: Diagnosis not present

## 2021-03-07 DIAGNOSIS — N921 Excessive and frequent menstruation with irregular cycle: Secondary | ICD-10-CM

## 2021-03-07 MED ORDER — TRIAMCINOLONE ACETONIDE 0.025 % EX OINT: 1.0000 "application " | TOPICAL_OINTMENT | Freq: Two times a day (BID) | CUTANEOUS | 1 refills | Status: DC

## 2021-03-07 NOTE — Progress Notes (Signed)
GYNECOLOGY  VISIT   HPI: 45 y.o.   Married  [place] American  female   No obstetric history on file. with Patient's last menstrual period was 02/08/2021 (exact date).   here for pre-op exam.    Her mother needed surgery, and so patient needed to reschedule her own surgery to September.  Desires surgery for heavy and irregular menses and for urinary incontinence. She wants to do hysterectomy. Declines future childbearing.    Leaks urine with sneeze, cough, and laugh. Wears a pad.   Urodynamic testing done 12/26/20:  showed stress incontinence.  Uroflow:  continuous.  Void 371 cc. PVR 10 cc. CMG:  S1 143 cc, S2 207 cc, S3 336 cc.  VLPP 102 cm H2O.  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 UPP: 30 cm H2O. Pressure flow:  Pdet max 55 cm H2O.  Voided 353 cc.   She has menorrhagia with irregular cycles, likely due to anovulation. Her US showed 2 small fibroids, normal ovaries, and her endometrial biopsy was benign. She also has some back pain.   States vaginal/vulvar itching and discomfort.  She had Diflucan on hand and she two, last one yesterday.  Still having symptoms.  Some fishy odor.   Expecting her period to start today.   Having some hot flashes and night sweats.   GYNECOLOGIC HISTORY: Patient's last menstrual period was 02/08/2021 (exact date). Contraception:  Tubal Menopausal hormone therapy:  none Last mammogram:  Fall 2020-normal per patient. Thinks it was done last year at [place], [place].  Last pap smear:  08-11-20 Neg:Neg HR HPV        OB History   No obstetric history on file.        Patient Active Problem List   Diagnosis Date Noted   Menorrhagia with irregular cycle 02/05/2021   Fibroids 02/05/2021   Urinary, incontinence, stress female 01/06/2021    Past Medical History:  Diagnosis Date   Cervical herniated disc    reports pinched nerve also   Superficial thrombosis of leg, right 2007    Past Surgical History:  Procedure  Laterality Date   TUBAL LIGATION      Current Outpatient Medications  Medication Sig Dispense Refill   Ascorbic Acid (VITAMIN C PO) Take 1 tablet by mouth daily.     Cyanocobalamin (VITAMIN B 12 PO) Take 1 tablet by mouth daily.     Multiple Vitamins-Minerals (ZINC PO) Take 1 tablet by mouth daily.     Vitamin D, Ergocalciferol, (DRISDOL) 1.25 MG (50000 UNIT) CAPS capsule Take 50,000 Units by mouth once a week.     No current facility-administered medications for this visit.     ALLERGIES: Clindamycin/lincomycin and Penicillins  Family History  Problem Relation Age of Onset   Breast cancer Mother    Diabetes Mother    Hypertension Mother    Hyperlipidemia Mother    Hypertension Father    Hyperlipidemia Father    Diabetes Father    Heart disease Father    Rheum arthritis Father    Hypertension Sister    Breast cancer Paternal Aunt     Social History   Socioeconomic History   Marital status: Married    Spouse name: Not on file   Number of children: Not on file   Years of education: Not on file   Highest education level: Not on file  Occupational History   Not on file  Tobacco Use   Smoking status: Former    Types: Cigarettes  Quit date: 12/31/2001    Years since quitting: 19.1   Smokeless tobacco: Never  Vaping Use   Vaping Use: Never used  Substance and Sexual Activity   Alcohol use: Not Currently   Drug use: Never   Sexual activity: Yes    Birth control/protection: Surgical    Comment: BTL  Other Topics Concern   Not on file  Social History Narrative   Not on file   Social Determinants of Health   Financial Resource Strain: Not on file  Food Insecurity: Not on file  Transportation Needs: Not on file  Physical Activity: Not on file  Stress: Not on file  Social Connections: Not on file  Intimate Partner Violence: Not on file    Review of Systems  All other systems reviewed and are negative.  PHYSICAL EXAMINATION:    BP 120/60   Pulse 63    Ht '5\' 6"'$  (1.676 m)   Wt 258 lb (117 kg)   LMP 02/08/2021 (Exact Date)   SpO2 98%   BMI 41.64 kg/m     General appearance: alert, cooperative and appears stated age Head: Normocephalic, without obvious abnormality, atraumatic Neck: no adenopathy, supple, symmetrical, trachea midline and thyroid normal to inspection and palpation Heart: regular rate and rhythm Abdomen: soft, non-tender, no masses,  no organomegaly Extremities: extremities normal, atraumatic, no cyanosis or edema Skin: Skin color, texture, turgor normal. No rashes or lesions No abnormal inguinal nodes palpated Neurologic: Grossly normal  Pelvic: External genitalia:  no lesions              Urethra:  normal appearing urethra with no masses, tenderness or lesions              Bartholins and Skenes: normal                 Vagina: normal appearing vagina with normal color and discharge, no lesions              Cervix: no lesions                Bimanual Exam:  Uterus:  normal size, contour, position, consistency, mobility, non-tender              Adnexa: no mass, fullness, tenderness          Chaperone was present for exam:  Estill Bamberg, CMA  ASSESSMENT  Menorrhagia with irregular menses.  Fibroids. Stress urinary incontinence.  Status post BTL. Herniated disc and pinched nerve. Hx superficial thrombosis of right leg.  PLAN  Will proceed with total laparoscopic hysterectomy with bilateral salpingectomy, TVT Exact midurethral sling, cystoscopy.  Will keep ovaries unless are abnormal. Risks, benefits, and alternatives have been reviewed with the patient who wishes to proceed.  Plan for overnight stay due to distance of home from hospital.  Vaginitis swab sent.  Rx for Triamcinolone 0.25% to vulva bid prn.  Try hypoallergenic pads.   An After Visit Summary was printed and given to the patient.  20 min  total time was spent for this patient encounter, including preparation, face-to-face counseling with the patient,  coordination of care, and documentation of the encounter.

## 2021-03-08 LAB — CERVICOVAGINAL ANCILLARY ONLY
Bacterial Vaginitis (gardnerella): NEGATIVE
Candida Glabrata: NEGATIVE
Candida Vaginitis: POSITIVE — AB
Comment: NEGATIVE
Comment: NEGATIVE
Comment: NEGATIVE
Comment: NEGATIVE
Trichomonas: NEGATIVE

## 2021-03-23 DIAGNOSIS — R7309 Other abnormal glucose: Secondary | ICD-10-CM

## 2021-03-23 HISTORY — DX: Other abnormal glucose: R73.09

## 2021-03-29 ENCOUNTER — Other Ambulatory Visit: Payer: Self-pay

## 2021-03-29 ENCOUNTER — Encounter (HOSPITAL_BASED_OUTPATIENT_CLINIC_OR_DEPARTMENT_OTHER): Payer: Self-pay | Admitting: Obstetrics and Gynecology

## 2021-03-29 DIAGNOSIS — Z973 Presence of spectacles and contact lenses: Secondary | ICD-10-CM

## 2021-03-29 DIAGNOSIS — Z01812 Encounter for preprocedural laboratory examination: Secondary | ICD-10-CM | POA: Diagnosis present

## 2021-03-29 DIAGNOSIS — E559 Vitamin D deficiency, unspecified: Secondary | ICD-10-CM

## 2021-03-29 HISTORY — DX: Vitamin D deficiency, unspecified: E55.9

## 2021-03-29 HISTORY — DX: Presence of spectacles and contact lenses: Z97.3

## 2021-03-29 NOTE — Progress Notes (Signed)
Spoke w/ via phone for pre-op interview---pt Lab needs dos---- urine preg              Lab results------lab appt 03-30-2021 at 1000 for cbc [address] [address] [place] test -----03-30-2021 Arrive at -------530 am 04-03-2021 NPO after MN NO Solid Food.  Clear liquids from MN until---430 am, ensure drink at 430 am then npo Med rec completed Medications to take morning of surgery -----none Diabetic medication -----n/a Patient instructed no nail polish to be worn day of surgery Patient instructed to bring photo id and insurance card day of surgery Patient aware to have Driver (ride ) / caregiver   spouse melvin for 24 hours after surgery  Patient Special Instructions -----eras presurgery drink at 430 am then npo Pre-Op special Istructions -----pt given overnight stay instructions Patient verbalized understanding of instructions that were given at this phone interview. Patient denies shortness of breath, chest pain, fever, cough at this phone interview.

## 2021-03-29 NOTE — Progress Notes (Signed)
YOU ARE SCHEDULED FOR A COVID TEST ON  03-30-2021  . THIS TEST MUST BE DONE BEFORE SURGERY. GO TO  [facility] [place] PHONE [phone]) AND REMAIN IN YOUR CAR, THIS IS A DRIVE UP TEST. AFTER YOUR COVID TEST , PLEASE WEAR A MASK OUT IN PUBLIC AND SOCIAL DISTANCE AND [place] YOUR HANDS FREQUENTLY. PLEASE ASK ALL YOUR CLOSE HOUSEHOLD CONTACT TO WEAR MASK OUT IN PUBLIC AND SOCIAL DISTANCE AND [place] HANDS FREQUENTLY ALSO.      Your procedure is scheduled on 04-03-2021  Report to [facility].   Call this number if you have problems the morning of surgery  :[phone].   OUR ADDRESS IS [place].  WE ARE LOCATED IN [facility].  PLEASE BRING YOUR INSURANCE CARD AND PHOTO ID DAY OF SURGERY.  ONLY ONE PERSON ALLOWED IN FACILITY WAITING AREA.                                     REMEMBER:   DO NOT EAT FOOD, CANDY GUM OR MINTS  AFTER MIDNIGHT . YOU MAY HAVE CLEAR LIQUIDS FROM MIDNIGHT UNTIL 430 AM.  DRINK ENSURE PRESURGERY DRINK AT 430 AM. NO CLEAR LIQUIDS AFTER 430 AM DAY OF SURGERY.   YOU MAY  BRUSH YOUR TEETH MORNING OF SURGERY AND RINSE YOUR MOUTH OUT, NO CHEWING GUM CANDY OR MINTS.    CLEAR LIQUID DIET   Foods Allowed                                                                     Foods Excluded  Coffee and tea, regular and decaf                             liquids that you cannot  Plain Jell-O any favor except red or purple                                           see through such as: Fruit ices (not with fruit pulp)                                     milk, soups, orange juice  Iced Popsicles                                    All solid food Carbonated beverages, regular and diet                                    Cranberry, grape and apple juices Sports drinks like Gatorade Lightly seasoned clear broth or consume(fat free) Sugar  Sample Menu Breakfast  Lunch                                     Supper Cranberry juice                    Beef broth                            Chicken broth Jell-O                                     Grape juice                           Apple juice Coffee or tea                        Jell-O                                      Popsicle                                                Coffee or tea                        Coffee or tea  _____________________________________________________________________     TAKE THESE MEDICATIONS MORNING OF SURGERY WITH A SIP OF WATER:NONE  ONE VISITOR IS ALLOWED IN WAITING ROOM ONLY DAY OF SURGERY.  NO VISITOR MAY SPEND THE NIGHT.  VISITOR ARE ALLOWED TO STAY UNTIL 800 PM.                                    DO NOT WEAR JEWERLY, MAKE UP. DO NOT WEAR LOTIONS, POWDERS, PERFUMES OR NAIL POLISH. DO NOT SHAVE FOR 48 HOURS PRIOR TO DAY OF SURGERY. MEN MAY SHAVE FACE AND NECK. CONTACTS, GLASSES, OR DENTURES MAY NOT BE WORN TO SURGERY.                                    [facility] IS NOT RESPONSIBLE  FOR ANY BELONGINGS.                                                                    Marland Kitchen           [place] - Preparing for Surgery Before surgery, you can play an important role.  Because skin is not sterile, your skin needs to be as free of germs as possible.  You can reduce the number of germs on your skin by washing with CHG (chlorahexidine gluconate) soap before surgery.  CHG is an antiseptic cleaner which  kills germs and bonds with the skin to continue killing germs even after washing. Please DO NOT use if you have an allergy to CHG or antibacterial soaps.  If your skin becomes reddened/irritated stop using the CHG and inform your nurse when you arrive at Short Stay. Do not shave (including legs and underarms) for at least 48 hours prior to the first CHG shower.  You may shave your face/neck. Please follow these instructions carefully:  1.  Shower with CHG Soap the night  before surgery and the  morning of Surgery.  2.  If you choose to wash your hair, wash your hair first as usual with your  normal  shampoo.  3.  After you shampoo, rinse your hair and body thoroughly to remove the  shampoo.                            4.  Use CHG as you would any other liquid soap.  You can apply chg directly  to the skin and wash                      Gently with a scrungie or clean washcloth.  5.  Apply the CHG Soap to your body ONLY FROM THE NECK DOWN.   Do not use on face/ open                           Wound or open sores. Avoid contact with eyes, ears mouth and genitals (private parts).                       Wash face,  Genitals (private parts) with your normal soap.             6.  Wash thoroughly, paying special attention to the area where your surgery  will be performed.  7.  Thoroughly rinse your body with warm water from the neck down.  8.  DO NOT shower/wash with your normal soap after using and rinsing off  the CHG Soap.                9.  Pat yourself dry with a clean towel.            10.  Wear clean pajamas.            11.  Place clean sheets on your bed the night of your first shower and do not  sleep with pets. Day of Surgery : Do not apply any lotions/deodorants the morning of surgery.  Please wear clean clothes to the hospital/surgery center.  FAILURE TO FOLLOW THESE INSTRUCTIONS MAY RESULT IN THE CANCELLATION OF YOUR SURGERY PATIENT SIGNATURE_________________________________  NURSE SIGNATURE__________________________________  ________________________________________________________________________                                                        QUESTIONS [facility] PRE OP NURSE PHONE [phone].

## 2021-03-30 ENCOUNTER — Encounter (HOSPITAL_COMMUNITY)
Admission: RE | Admit: 2021-03-30 | Discharge: 2021-03-30 | Disposition: A | Discharge status: Home / Self Care | Payer: Commercial Managed Care - PPO | Source: Ambulatory Visit | Attending: Obstetrics and Gynecology | Admitting: Obstetrics and Gynecology

## 2021-03-30 ENCOUNTER — Other Ambulatory Visit: Payer: Self-pay | Admitting: Obstetrics and Gynecology

## 2021-03-30 DIAGNOSIS — Z01812 Encounter for preprocedural laboratory examination: Secondary | ICD-10-CM | POA: Insufficient documentation

## 2021-03-30 LAB — CBC
HCT: 31.9 % — ABNORMAL LOW (ref 36.0–46.0)
Hemoglobin: 10 g/dL — ABNORMAL LOW (ref 12.0–15.0)
MCH: 27.9 pg (ref 26.0–34.0)
MCHC: 31.3 g/dL (ref 30.0–36.0)
MCV: 89.1 fL (ref 80.0–100.0)
Platelets: 352 10*3/uL (ref 150–400)
RBC: 3.58 MIL/uL — ABNORMAL LOW (ref 3.87–5.11)
RDW: 13.9 % (ref 11.5–15.5)
WBC: 7.4 10*3/uL (ref 4.0–10.5)
nRBC: 0 % (ref 0.0–0.2)

## 2021-03-30 LAB — SARS CORONAVIRUS 2 (TAT 6-24 HRS): SARS Coronavirus 2: NEGATIVE

## 2021-03-30 LAB — BASIC METABOLIC PANEL
Anion gap: 9 (ref 5–15)
BUN: 8 mg/dL (ref 6–20)
CO2: 22 mmol/L (ref 22–32)
Calcium: 8.5 mg/dL — ABNORMAL LOW (ref 8.9–10.3)
Chloride: 106 mmol/L (ref 98–111)
Creatinine, Ser: 0.48 mg/dL (ref 0.44–1.00)
GFR, Estimated: >60 mL/min (ref >60)
Glucose, Bld: 135 mg/dL — ABNORMAL HIGH (ref 70–99)
Potassium: 3.4 mmol/L — ABNORMAL LOW (ref 3.5–5.1)
Sodium: 137 mmol/L (ref 135–145)

## 2021-04-02 ENCOUNTER — Encounter (HOSPITAL_BASED_OUTPATIENT_CLINIC_OR_DEPARTMENT_OTHER): Payer: Self-pay | Admitting: Obstetrics and Gynecology

## 2021-04-02 NOTE — H&P (Signed)
Office Visit 03/07/2021 [facility], Everardo All, MD Obstetrics and Gynecology Vulvovaginitis +2 more Dx Pre-op Exam  Reason for Visit   Additional Documentation  Vitals:  BP 120/60 Pulse 63 Ht '5\' 6"'$  (1.676 m) Wt 117 kg LMP 02/08/2021 (Exact Date) SpO2 98% BMI 41.64 kg/m BSA 2.33 m  Flowsheets:  NEWS, MEWS Score, Anthropometrics, Method of Visit   Encounter Info:  Billing Info, History, Allergies, Detailed Report    All Notes    Progress Notes by Nunzio Cobbs, MD at 03/07/2021 11:30 AM  Author: Nunzio Cobbs, MD Author Type: Physician Filed: 03/07/2021 12:23 PM  Note Status: Signed Cosign: Cosign Not Required Encounter Date: 03/07/2021  Editor: Nunzio Cobbs, MD (Physician)      Prior Versions: 1. Lowella Fairy, CMA (Certified Psychologist, sport and exercise) at 03/07/2021 11:46 AM - Sign when Signing Visit    GYNECOLOGY  VISIT   HPI: 45 y.o.   Married  [place] American  female   No obstetric history on file. with Patient's last menstrual period was 02/08/2021 (exact date).   here for pre-op exam.     Her mother needed surgery, and so patient needed to reschedule her own surgery to September.   Desires surgery for heavy and irregular menses and for urinary incontinence. She wants to do hysterectomy. Declines future childbearing.    Leaks urine with sneeze, cough, and laugh. Wears a pad.   Urodynamic testing done 12/26/20:  showed stress incontinence.  Uroflow:  continuous.  Void 371 cc. PVR 10 cc. CMG:  S1 143 cc, S2 207 cc, S3 336 cc.  VLPP 102 cm H2O.  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 UPP: 30 cm H2O. Pressure flow:  Pdet max 55 cm H2O.  Voided 353 cc.   She has menorrhagia with irregular cycles, likely due to anovulation. Her US showed 2 small fibroids, normal ovaries, and her endometrial biopsy was benign. She also has some back pain.   States vaginal/vulvar itching and  discomfort.  She had Diflucan on hand and she two, last one yesterday.  Still having symptoms.  Some fishy odor.    Expecting her period to start today.    Having some hot flashes and night sweats.    GYNECOLOGIC HISTORY: Patient's last menstrual period was 02/08/2021 (exact date). Contraception:  Tubal Menopausal hormone therapy:  none Last mammogram:  Fall 2020-normal per patient. Thinks it was done last year at [place], [place].  Last pap smear:  08-11-20 Neg:Neg HR HPV        OB History   No obstetric history on file.              Patient Active Problem List    Diagnosis Date Noted   Menorrhagia with irregular cycle 02/05/2021   Fibroids 02/05/2021   Urinary, incontinence, stress female 01/06/2021          Past Medical History:  Diagnosis Date   Cervical herniated disc      reports pinched nerve also   Superficial thrombosis of leg, right 2007           Past Surgical History:  Procedure Laterality Date   TUBAL LIGATION                Current Outpatient Medications  Medication Sig Dispense Refill   Ascorbic Acid (VITAMIN C PO) Take 1 tablet by mouth daily.       Cyanocobalamin (VITAMIN B  12 PO) Take 1 tablet by mouth daily.       Multiple Vitamins-Minerals (ZINC PO) Take 1 tablet by mouth daily.       Vitamin D, Ergocalciferol, (DRISDOL) 1.25 MG (50000 UNIT) CAPS capsule Take 50,000 Units by mouth once a week.        No current facility-administered medications for this visit.      ALLERGIES: Clindamycin/lincomycin and Penicillins        Family History  Problem Relation Age of Onset   Breast cancer Mother     Diabetes Mother     Hypertension Mother     Hyperlipidemia Mother     Hypertension Father     Hyperlipidemia Father     Diabetes Father     Heart disease Father     Rheum arthritis Father     Hypertension Sister     Breast cancer Paternal Aunt        Social History         Socioeconomic History   Marital status: Married       Spouse name: Not on file   Number of children: Not on file   Years of education: Not on file   Highest education level: Not on file  Occupational History   Not on file  Tobacco Use   Smoking status: Former      Types: Cigarettes      Quit date: 12/31/2001      Years since quitting: 19.1   Smokeless tobacco: Never  Vaping Use   Vaping Use: Never used  Substance and Sexual Activity   Alcohol use: Not Currently   Drug use: Never   Sexual activity: Yes      Birth control/protection: Surgical      Comment: BTL  Other Topics Concern   Not on file  Social History Narrative   Not on file    Social Determinants of Health    Financial Resource Strain: Not on file  Food Insecurity: Not on file  Transportation Needs: Not on file  Physical Activity: Not on file  Stress: Not on file  Social Connections: Not on file  Intimate Partner Violence: Not on file      Review of Systems  All other systems reviewed and are negative.   PHYSICAL EXAMINATION:     BP 120/60   Pulse 63   Ht '5\' 6"'$  (1.676 m)   Wt 258 lb (117 kg)   LMP 02/08/2021 (Exact Date)   SpO2 98%   BMI 41.64 kg/m     General appearance: alert, cooperative and appears stated age Head: Normocephalic, without obvious abnormality, atraumatic Neck: no adenopathy, supple, symmetrical, trachea midline and thyroid normal to inspection and palpation Heart: regular rate and rhythm Abdomen: soft, non-tender, no masses,  no organomegaly Extremities: extremities normal, atraumatic, no cyanosis or edema Skin: Skin color, texture, turgor normal. No rashes or lesions No abnormal inguinal nodes palpated Neurologic: Grossly normal   Pelvic: External genitalia:  no lesions              Urethra:  normal appearing urethra with no masses, tenderness or lesions              Bartholins and Skenes: normal                 Vagina: normal appearing vagina with normal color and discharge, no lesions              Cervix: no lesions  Bimanual Exam:  Uterus:  normal size, contour, position, consistency, mobility, non-tender              Adnexa: no mass, fullness, tenderness           Chaperone was present for exam:  Estill Bamberg, CMA   ASSESSMENT   Menorrhagia with irregular menses.  Fibroids. Stress urinary incontinence.  Status post BTL. Herniated disc and pinched nerve. Hx superficial thrombosis of right leg.   PLAN   Will proceed with total laparoscopic hysterectomy with bilateral salpingectomy, TVT Exact midurethral sling, cystoscopy.  Will keep ovaries unless are abnormal. Risks, benefits, and alternatives have been reviewed with the patient who wishes to proceed.  Plan for overnight stay due to distance of home from hospital.   Vaginitis swab sent.  Rx for Triamcinolone 0.25% to vulva bid prn.  Try hypoallergenic pads.   An After Visit Summary was printed and given to the patient.   20 min  total time was spent for this patient encounter, including preparation, face-to-face counseling with the patient, coordination of care, and documentation of the encounter.

## 2021-04-02 NOTE — Anesthesia Preprocedure Evaluation (Addendum)
Anesthesia Evaluation  Patient identified by MRN, date of birth, ID band Patient awake    Reviewed: Allergy & Precautions, NPO status , Patient's Chart, lab work & pertinent test results  Airway Mallampati: III  TM Distance: >3 FB Neck ROM: Full    Dental  (+) Teeth Intact, Dental Advisory Given   Pulmonary neg pulmonary ROS,    breath sounds clear to auscultation       Cardiovascular negative cardio ROS   Rhythm:Regular Rate:Normal     Neuro/Psych negative neurological ROS  negative psych ROS   GI/Hepatic negative GI ROS, Neg liver ROS,   Endo/Other  negative endocrine ROS  Renal/GU negative Renal ROS     Musculoskeletal   Abdominal Normal abdominal exam  (+)   Peds  Hematology negative hematology ROS (+)   Anesthesia Other Findings   Reproductive/Obstetrics                            Anesthesia Physical Anesthesia Plan  ASA: 3  Anesthesia Plan: General   Post-op Pain Management:    Induction: Intravenous  PONV Risk Score and Plan: 4 or greater and Ondansetron, Dexamethasone, Midazolam and Scopolamine patch - Pre-op  Airway Management Planned: Oral ETT  Additional Equipment: None  Intra-op Plan:   Post-operative Plan: Extubation in OR  Informed Consent: I have reviewed the patients History and Physical, chart, labs and discussed the procedure including the risks, benefits and alternatives for the proposed anesthesia with the patient or authorized representative who has indicated his/her understanding and acceptance.     Dental advisory given  Plan Discussed with: CRNA  Anesthesia Plan Comments:       Anesthesia Quick Evaluation

## 2021-04-03 ENCOUNTER — Encounter (HOSPITAL_BASED_OUTPATIENT_CLINIC_OR_DEPARTMENT_OTHER): Payer: Self-pay | Admitting: Obstetrics and Gynecology

## 2021-04-03 ENCOUNTER — Observation Stay (HOSPITAL_BASED_OUTPATIENT_CLINIC_OR_DEPARTMENT_OTHER): Payer: Commercial Managed Care - PPO | Admitting: Anesthesiology

## 2021-04-03 ENCOUNTER — Encounter (HOSPITAL_BASED_OUTPATIENT_CLINIC_OR_DEPARTMENT_OTHER)
Admission: RE | Disposition: A | Discharge status: Home / Self Care | Payer: Self-pay | Source: Home / Self Care | Attending: Obstetrics and Gynecology

## 2021-04-03 ENCOUNTER — Observation Stay (HOSPITAL_BASED_OUTPATIENT_CLINIC_OR_DEPARTMENT_OTHER)
Admission: RE | Admit: 2021-04-03 | Discharge: 2021-04-04 | Disposition: A | Discharge status: Home / Self Care | Payer: Commercial Managed Care - PPO | Attending: Obstetrics and Gynecology | Admitting: Obstetrics and Gynecology

## 2021-04-03 ENCOUNTER — Other Ambulatory Visit: Payer: Self-pay

## 2021-04-03 DIAGNOSIS — Z90710 Acquired absence of both cervix and uterus: Secondary | ICD-10-CM | POA: Diagnosis present

## 2021-04-03 DIAGNOSIS — N921 Excessive and frequent menstruation with irregular cycle: Secondary | ICD-10-CM | POA: Diagnosis present

## 2021-04-03 DIAGNOSIS — N393 Stress incontinence (female) (male): Secondary | ICD-10-CM | POA: Insufficient documentation

## 2021-04-03 DIAGNOSIS — Z87891 Personal history of nicotine dependence: Secondary | ICD-10-CM | POA: Insufficient documentation

## 2021-04-03 DIAGNOSIS — D259 Leiomyoma of uterus, unspecified: Secondary | ICD-10-CM | POA: Diagnosis not present

## 2021-04-03 HISTORY — PX: BLADDER SUSPENSION: SHX72

## 2021-04-03 HISTORY — PX: TOTAL LAPAROSCOPIC HYSTERECTOMY WITH SALPINGECTOMY: SHX6742

## 2021-04-03 HISTORY — PX: CYSTOSCOPY: SHX5120

## 2021-04-03 LAB — TYPE AND SCREEN
ABO/RH(D): O POS
Antibody Screen: NEGATIVE

## 2021-04-03 LAB — POCT PREGNANCY, URINE: Preg Test, Ur: NEGATIVE

## 2021-04-03 LAB — ABO/RH: ABO/RH(D): O POS

## 2021-04-03 SURGERY — HYSTERECTOMY, TOTAL, LAPAROSCOPIC, WITH SALPINGECTOMY
Anesthesia: General | Site: Bladder

## 2021-04-03 MED ORDER — CIPROFLOXACIN IN D5W 400 MG/200ML IV SOLN
400.0000 mg | INTRAVENOUS | Status: AC
  Administered 2021-04-03: 400 mg via INTRAVENOUS

## 2021-04-03 MED ORDER — SUGAMMADEX SODIUM 200 MG/2ML IV SOLN
INTRAVENOUS | Status: DC | PRN
  Administered 2021-04-03: 200 mg via INTRAVENOUS

## 2021-04-03 MED ORDER — THROMBIN (RECOMBINANT) 5000 UNITS EX SOLR
CUTANEOUS | Status: DC | PRN
  Administered 2021-04-03: 5000 [IU] via TOPICAL

## 2021-04-03 MED ORDER — GABAPENTIN 300 MG PO CAPS
ORAL_CAPSULE | ORAL | Status: AC
  Filled 2021-04-03: qty 1

## 2021-04-03 MED ORDER — AMISULPRIDE (ANTIEMETIC) 5 MG/2ML IV SOLN
INTRAVENOUS | Status: AC
  Filled 2021-04-03: qty 4

## 2021-04-03 MED ORDER — SODIUM CHLORIDE 0.9 % IV SOLN
INTRAVENOUS | Status: DC | PRN
  Administered 2021-04-03: 60 mL

## 2021-04-03 MED ORDER — ACETAMINOPHEN 500 MG PO TABS
1000.0000 mg | ORAL_TABLET | ORAL | Status: AC
  Administered 2021-04-03: 1000 mg via ORAL

## 2021-04-03 MED ORDER — OXYCODONE-ACETAMINOPHEN 5-325 MG PO TABS
ORAL_TABLET | ORAL | Status: AC
  Filled 2021-04-03: qty 2

## 2021-04-03 MED ORDER — LIDOCAINE-EPINEPHRINE 1 %-1:100000 IJ SOLN
INTRAMUSCULAR | Status: DC | PRN
  Administered 2021-04-03: 15 mL

## 2021-04-03 MED ORDER — FENTANYL CITRATE (PF) 100 MCG/2ML IJ SOLN
25.0000 ug | INTRAMUSCULAR | Status: DC | PRN
Start: 2021-04-03 — End: 2021-04-04

## 2021-04-03 MED ORDER — GABAPENTIN 300 MG PO CAPS
300.0000 mg | ORAL_CAPSULE | ORAL | Status: AC
  Administered 2021-04-03: 300 mg via ORAL

## 2021-04-03 MED ORDER — METRONIDAZOLE 500 MG/100ML IV SOLN
INTRAVENOUS | Status: AC
  Filled 2021-04-03: qty 100

## 2021-04-03 MED ORDER — MORPHINE SULFATE (PF) 2 MG/ML IV SOLN
INTRAVENOUS | Status: AC
  Filled 2021-04-03: qty 1

## 2021-04-03 MED ORDER — PROMETHAZINE HCL 25 MG/ML IJ SOLN: 6.2500 mg | INTRAMUSCULAR | Status: DC | PRN

## 2021-04-03 MED ORDER — OXYCODONE HCL 5 MG PO TABS: 5.0000 mg | ORAL_TABLET | Freq: Once | ORAL | Status: DC | PRN

## 2021-04-03 MED ORDER — MIDAZOLAM HCL 2 MG/2ML IJ SOLN
INTRAMUSCULAR | Status: AC
  Filled 2021-04-03: qty 2

## 2021-04-03 MED ORDER — LACTATED RINGERS IV SOLN: INTRAVENOUS | Status: DC

## 2021-04-03 MED ORDER — SODIUM CHLORIDE 0.9 % IR SOLN
Status: DC | PRN
  Administered 2021-04-03: 1000 mL

## 2021-04-03 MED ORDER — FENTANYL CITRATE (PF) 250 MCG/5ML IJ SOLN
INTRAMUSCULAR | Status: AC
  Filled 2021-04-03: qty 5

## 2021-04-03 MED ORDER — KETOROLAC TROMETHAMINE 30 MG/ML IJ SOLN
INTRAMUSCULAR | Status: AC
  Filled 2021-04-03: qty 1

## 2021-04-03 MED ORDER — SCOPOLAMINE 1 MG/3DAYS TD PT72
1.0000 | MEDICATED_PATCH | TRANSDERMAL | Status: DC
  Administered 2021-04-03: 1.5 mg via TRANSDERMAL

## 2021-04-03 MED ORDER — AMISULPRIDE (ANTIEMETIC) 5 MG/2ML IV SOLN
10.0000 mg | Freq: Once | INTRAVENOUS | Status: AC | PRN
  Administered 2021-04-03: 10 mg via INTRAVENOUS

## 2021-04-03 MED ORDER — ACETAMINOPHEN 500 MG PO TABS
ORAL_TABLET | ORAL | Status: AC
  Filled 2021-04-03: qty 2

## 2021-04-03 MED ORDER — ENOXAPARIN SODIUM 40 MG/0.4ML IJ SOSY
40.0000 mg | PREFILLED_SYRINGE | INTRAMUSCULAR | Status: AC
  Administered 2021-04-03: 40 mg via SUBCUTANEOUS

## 2021-04-03 MED ORDER — PHENYLEPHRINE HCL (PRESSORS) 10 MG/ML IV SOLN
INTRAVENOUS | Status: DC | PRN
  Administered 2021-04-03 (×2): 80 ug via INTRAVENOUS

## 2021-04-03 MED ORDER — ONDANSETRON HCL 4 MG/2ML IJ SOLN
INTRAMUSCULAR | Status: DC | PRN
  Administered 2021-04-03: 4 mg via INTRAVENOUS

## 2021-04-03 MED ORDER — OXYCODONE HCL 5 MG/5ML PO SOLN
5.0000 mg | Freq: Once | ORAL | Status: DC | PRN
Start: 2021-04-03 — End: 2021-04-04

## 2021-04-03 MED ORDER — MORPHINE SULFATE (PF) 4 MG/ML IV SOLN
1.0000 mg | INTRAVENOUS | Status: DC | PRN
  Administered 2021-04-03: 2 mg via INTRAVENOUS

## 2021-04-03 MED ORDER — BUPIVACAINE HCL (PF) 0.25 % IJ SOLN
INTRAMUSCULAR | Status: DC | PRN
  Administered 2021-04-03: 6 mL

## 2021-04-03 MED ORDER — KETOROLAC TROMETHAMINE 30 MG/ML IJ SOLN
30.0000 mg | Freq: Four times a day (QID) | INTRAMUSCULAR | Status: DC
  Administered 2021-04-03 – 2021-04-04 (×4): 30 mg via INTRAVENOUS

## 2021-04-03 MED ORDER — ACETAMINOPHEN 10 MG/ML IV SOLN: 1000.0000 mg | Freq: Once | INTRAVENOUS | Status: DC | PRN

## 2021-04-03 MED ORDER — ROCURONIUM BROMIDE 10 MG/ML (PF) SYRINGE
PREFILLED_SYRINGE | INTRAVENOUS | Status: AC
  Filled 2021-04-03: qty 10

## 2021-04-03 MED ORDER — DEXAMETHASONE SODIUM PHOSPHATE 4 MG/ML IJ SOLN
INTRAMUSCULAR | Status: DC | PRN
  Administered 2021-04-03: 10 mg via INTRAVENOUS

## 2021-04-03 MED ORDER — ONDANSETRON HCL 4 MG/2ML IJ SOLN
INTRAMUSCULAR | Status: AC
  Filled 2021-04-03: qty 2

## 2021-04-03 MED ORDER — OXYCODONE-ACETAMINOPHEN 5-325 MG PO TABS
1.0000 | ORAL_TABLET | ORAL | Status: DC | PRN
  Administered 2021-04-03 – 2021-04-04 (×4): 2 via ORAL
  Administered 2021-04-04: 1 via ORAL
  Administered 2021-04-04: 2 via ORAL

## 2021-04-03 MED ORDER — GLYCOPYRROLATE PF 0.2 MG/ML IJ SOSY
PREFILLED_SYRINGE | INTRAMUSCULAR | Status: AC
  Filled 2021-04-03: qty 1

## 2021-04-03 MED ORDER — ACETAMINOPHEN 325 MG PO TABS
325.0000 mg | ORAL_TABLET | ORAL | Status: DC | PRN
Start: 2021-04-03 — End: 2021-04-04

## 2021-04-03 MED ORDER — PHENYLEPHRINE 40 MCG/ML (10ML) SYRINGE FOR IV PUSH (FOR BLOOD PRESSURE SUPPORT)
PREFILLED_SYRINGE | INTRAVENOUS | Status: AC
  Filled 2021-04-03: qty 10

## 2021-04-03 MED ORDER — MIDAZOLAM HCL 5 MG/5ML IJ SOLN
INTRAMUSCULAR | Status: DC | PRN
  Administered 2021-04-03: 2 mg via INTRAVENOUS

## 2021-04-03 MED ORDER — ACETAMINOPHEN 160 MG/5ML PO SOLN: 325.0000 mg | ORAL | Status: DC | PRN

## 2021-04-03 MED ORDER — PROPOFOL 10 MG/ML IV BOLUS
INTRAVENOUS | Status: AC
  Filled 2021-04-03: qty 20

## 2021-04-03 MED ORDER — ENOXAPARIN SODIUM 40 MG/0.4ML IJ SOSY
PREFILLED_SYRINGE | INTRAMUSCULAR | Status: AC
  Filled 2021-04-03: qty 0.4

## 2021-04-03 MED ORDER — POVIDONE-IODINE 10 % EX SWAB: 2.0000 "application " | Freq: Once | CUTANEOUS | Status: DC

## 2021-04-03 MED ORDER — OXYCODONE HCL 5 MG PO TABS
ORAL_TABLET | ORAL | Status: AC
  Filled 2021-04-03: qty 2

## 2021-04-03 MED ORDER — DEXAMETHASONE SODIUM PHOSPHATE 10 MG/ML IJ SOLN
INTRAMUSCULAR | Status: AC
  Filled 2021-04-03: qty 1

## 2021-04-03 MED ORDER — SCOPOLAMINE 1 MG/3DAYS TD PT72
MEDICATED_PATCH | TRANSDERMAL | Status: AC
  Filled 2021-04-03: qty 1

## 2021-04-03 MED ORDER — PROPOFOL 10 MG/ML IV BOLUS
INTRAVENOUS | Status: DC | PRN
  Administered 2021-04-03: 180 mg via INTRAVENOUS

## 2021-04-03 MED ORDER — FENTANYL CITRATE (PF) 100 MCG/2ML IJ SOLN
INTRAMUSCULAR | Status: DC | PRN
  Administered 2021-04-03: 50 ug via INTRAVENOUS
  Administered 2021-04-03: 25 ug via INTRAVENOUS
  Administered 2021-04-03: 50 ug via INTRAVENOUS
  Administered 2021-04-03: 100 ug via INTRAVENOUS
  Administered 2021-04-03: 50 ug via INTRAVENOUS

## 2021-04-03 MED ORDER — ONDANSETRON HCL 4 MG PO TABS: 4.0000 mg | ORAL_TABLET | Freq: Four times a day (QID) | ORAL | Status: DC | PRN

## 2021-04-03 MED ORDER — FENTANYL CITRATE (PF) 100 MCG/2ML IJ SOLN
INTRAMUSCULAR | Status: AC
  Filled 2021-04-03: qty 2

## 2021-04-03 MED ORDER — IBUPROFEN 800 MG PO TABS: 800.0000 mg | ORAL_TABLET | Freq: Three times a day (TID) | ORAL | Status: DC | PRN

## 2021-04-03 MED ORDER — MENTHOL 3 MG MT LOZG
1.0000 | LOZENGE | OROMUCOSAL | Status: DC | PRN
  Administered 2021-04-04: 3 mg via ORAL

## 2021-04-03 MED ORDER — LIDOCAINE HCL (CARDIAC) PF 100 MG/5ML IV SOSY
PREFILLED_SYRINGE | INTRAVENOUS | Status: DC | PRN
  Administered 2021-04-03: 100 mg via INTRAVENOUS

## 2021-04-03 MED ORDER — ONDANSETRON HCL 4 MG/2ML IJ SOLN: 4.0000 mg | Freq: Four times a day (QID) | INTRAMUSCULAR | Status: DC | PRN

## 2021-04-03 MED ORDER — METRONIDAZOLE 500 MG/100ML IV SOLN
500.0000 mg | INTRAVENOUS | Status: AC
  Administered 2021-04-03: 500 mg via INTRAVENOUS

## 2021-04-03 MED ORDER — ROCURONIUM BROMIDE 100 MG/10ML IV SOLN
INTRAVENOUS | Status: DC | PRN
  Administered 2021-04-03: 70 mg via INTRAVENOUS
  Administered 2021-04-03 (×2): 10 mg via INTRAVENOUS

## 2021-04-03 MED ORDER — CIPROFLOXACIN IN D5W 400 MG/200ML IV SOLN
INTRAVENOUS | Status: AC
  Filled 2021-04-03: qty 200

## 2021-04-03 MED ORDER — LIDOCAINE HCL (PF) 2 % IJ SOLN
INTRAMUSCULAR | Status: AC
  Filled 2021-04-03: qty 5

## 2021-04-03 SURGICAL SUPPLY — 81 items
ADH SKN CLS APL DERMABOND .7 (GAUZE/BANDAGES/DRESSINGS) ×3
AGENT HMST KT MTR STRL THRMB (HEMOSTASIS)
APL SRG 38 LTWT LNG FL B (MISCELLANEOUS)
APPLICATOR ARISTA FLEXITIP XL (MISCELLANEOUS) IMPLANT
BARRIER ADHS 3X4 INTERCEED (GAUZE/BANDAGES/DRESSINGS) IMPLANT
BLADE SURG 11 STRL SS (BLADE) ×5 IMPLANT
BLADE SURG 15 STRL LF DISP TIS (BLADE) ×4 IMPLANT
BLADE SURG 15 STRL SS (BLADE) ×4
BRR ADH 4X3 ABS CNTRL BYND (GAUZE/BANDAGES/DRESSINGS)
CABLE HIGH FREQUENCY MONO STRZ (ELECTRODE) IMPLANT
CATH FOLEY 2WAY SLVR  5CC 18FR (CATHETERS) ×1
CATH FOLEY 2WAY SLVR 5CC 18FR (CATHETERS) ×4 IMPLANT
CELL SAVER LIPIGURD (MISCELLANEOUS) IMPLANT
COVER BACK TABLE 60X90IN (DRAPES) ×2 IMPLANT
COVER MAYO STAND STRL (DRAPES) ×5 IMPLANT
DECANTER SPIKE VIAL GLASS SM (MISCELLANEOUS) ×10 IMPLANT
DERMABOND ADVANCED (GAUZE/BANDAGES/DRESSINGS) ×1
DERMABOND ADVANCED .7 DNX12 (GAUZE/BANDAGES/DRESSINGS) ×4 IMPLANT
DEVICE RETRIEVAL ALEXIS 14 (MISCELLANEOUS) IMPLANT
DRAPE SHEET LG 3/4 BI-LAMINATE (DRAPES) ×2 IMPLANT
DURAPREP 26ML APPLICATOR (WOUND CARE) ×5 IMPLANT
EXTRT SYSTEM ALEXIS 14CM (MISCELLANEOUS)
EXTRT SYSTEM ALEXIS 17CM (MISCELLANEOUS)
GAUZE 4X4 16PLY ~~LOC~~+RFID DBL (SPONGE) ×10 IMPLANT
GLOVE SURG ENC MOIS LTX SZ6.5 (GLOVE) ×10 IMPLANT
GLOVE SURG LTX SZ6.5 (GLOVE) ×2 IMPLANT
GLOVE SURG UNDER POLY LF SZ7 (GLOVE) ×5 IMPLANT
GOWN STRL REUS W/TWL LRG LVL3 (GOWN DISPOSABLE) ×20 IMPLANT
HEMOSTAT ARISTA ABSORB 3G PWDR (HEMOSTASIS) IMPLANT
HOLDER FOLEY CATH W/STRAP (MISCELLANEOUS) ×5 IMPLANT
KIT TURNOVER CYSTO (KITS) ×5 IMPLANT
LIGASURE VESSEL 5MM BLUNT TIP (ELECTROSURGICAL) ×5 IMPLANT
NEEDLE HYPO 22GX1.5 SAFETY (NEEDLE) ×5 IMPLANT
NEEDLE INSUFFLATION 120MM (ENDOMECHANICALS) ×5 IMPLANT
OCCLUDER COLPOPNEUMO (BALLOONS) ×5 IMPLANT
PACK LAPAROSCOPY BASIN (CUSTOM PROCEDURE TRAY) ×5 IMPLANT
PACK TRENDGUARD 450 HYBRID PRO (MISCELLANEOUS) ×1 IMPLANT
PACK VAGINAL WOMENS (CUSTOM PROCEDURE TRAY) ×5 IMPLANT
PACKING VAGINAL (PACKING) IMPLANT
POUCH LAPAROSCOPIC INSTRUMENT (MISCELLANEOUS) ×5 IMPLANT
PROTECTOR NERVE ULNAR (MISCELLANEOUS) ×10 IMPLANT
RETRACTOR WOUND ALXS 19CM XSML (INSTRUMENTS) IMPLANT
RTRCTR WOUND ALEXIS 19CM XSML (INSTRUMENTS)
SCISSORS LAP 5X35 DISP (ENDOMECHANICALS) IMPLANT
SET IRRIG Y TYPE TUR BLADDER L (SET/KITS/TRAYS/PACK) ×5 IMPLANT
SET SUCTION IRRIG HYDROSURG (IRRIGATION / IRRIGATOR) ×5 IMPLANT
SET TRI-LUMEN FLTR TB AIRSEAL (TUBING) ×5 IMPLANT
SHEARS 1100 HARMONIC 36 (ELECTROSURGICAL) ×5 IMPLANT
SHEET LAVH (DRAPES) ×5 IMPLANT
SLEEVE ADV FIXATION 5X100MM (TROCAR) ×5 IMPLANT
SLING TVT EXACT (Sling) ×2 IMPLANT
SURGIFLO W/THROMBIN 8M KIT (HEMOSTASIS) IMPLANT
SUT VIC AB 0 CT1 27 (SUTURE) ×8
SUT VIC AB 0 CT1 27XBRD ANBCTR (SUTURE) ×8 IMPLANT
SUT VIC AB 2-0 CT2 27 (SUTURE) IMPLANT
SUT VIC AB 2-0 SH 27 (SUTURE) ×4
SUT VIC AB 2-0 SH 27XBRD (SUTURE) ×4 IMPLANT
SUT VIC AB 4-0 PS2 18 (SUTURE) ×5 IMPLANT
SUT VICRYL 0 UR6 27IN ABS (SUTURE) IMPLANT
SUT VLOC 180 0 9IN  GS21 (SUTURE) ×1
SUT VLOC 180 0 9IN GS21 (SUTURE) ×1 IMPLANT
SYR 10ML LL (SYRINGE) ×5 IMPLANT
SYR 50ML LL SCALE MARK (SYRINGE) ×10 IMPLANT
SYR BULB EAR ULCER 3OZ GRN STR (SYRINGE) ×5 IMPLANT
SYSTEM CARTER THOMASON II (TROCAR) IMPLANT
SYSTEM CONTND EXTRCTN KII BLLN (MISCELLANEOUS) IMPLANT
TIP RUMI ORANGE 6.7MMX12CM (TIP) IMPLANT
TIP UTERINE 5.1X6CM LAV DISP (MISCELLANEOUS) IMPLANT
TIP UTERINE 6.7X10CM GRN DISP (MISCELLANEOUS) ×2 IMPLANT
TIP UTERINE 6.7X6CM WHT DISP (MISCELLANEOUS) IMPLANT
TIP UTERINE 6.7X8CM BLUE DISP (MISCELLANEOUS) IMPLANT
TOWEL OR 17X26 10 PK STRL BLUE (TOWEL DISPOSABLE) ×10 IMPLANT
TRAY FOLEY W/BAG SLVR 14FR LF (SET/KITS/TRAYS/PACK) ×5 IMPLANT
TRENDGUARD 450 HYBRID PRO PACK (MISCELLANEOUS) ×4
TROCAR ADV FIXATION 5X100MM (TROCAR) ×5 IMPLANT
TROCAR BLADELESS OPT 5 100 (ENDOMECHANICALS) ×5 IMPLANT
TROCAR PORT AIRSEAL 5X120 (TROCAR) ×5 IMPLANT
TROCAR PORT AIRSEAL 8X120 (TROCAR) IMPLANT
TROCAR XCEL NON BLADE 8MM B8LT (ENDOMECHANICALS) IMPLANT
TUBE CONNECTING 12X1/4 (SUCTIONS) ×5 IMPLANT
WARMER LAPAROSCOPE (MISCELLANEOUS) ×5 IMPLANT

## 2021-04-03 NOTE — Progress Notes (Signed)
\  Day of Surgery Procedure(s) (LRB): TOTAL LAPAROSCOPIC HYSTERECTOMY WITH SALPINGECTOMY (Bilateral) TRANSVAGINAL TAPE (TVT) PROCEDURE (N/A) CYSTOSCOPY (N/A)  Subjective: Patient reports pain is now well controlled.  Received Morphine and 2 Percocet.  Due for Toradol now.  Likes the heating pad for her abdomen.  Ambulated in halls.  Tolerated lunch.   Objective: I have reviewed patient's vital signs and intake and output. Vitals:   04/03/21 1257 04/03/21 1601  BP: (!) 145/84 (!) 141/71  Pulse: 89 96  Resp: 16 14  Temp: 98.6 F (37 C) 98.7 F (37.1 C)  SpO2: 100% 99%   I/O - 2814 cc/1800 cc  Gen:  Sleepy but appropriate conversation.  Lungs:  CTA bilaterally.  Cor:  S1S2 RRR.  Abd:  Normal bowel sounds, soft, nontender.  Incisions:  clean, dry, and intact.  Vaginal pad:  mild to moderate blood staining.  Ext:  PAS and Ted hose on.  DPs 2+ bilaterally.   Assessment: s/p Procedure(s): TOTAL LAPAROSCOPIC HYSTERECTOMY WITH SALPINGECTOMY (Bilateral) TRANSVAGINAL TAPE (TVT) PROCEDURE (N/A) CYSTOSCOPY (N/A): stable  Plan: Continue Foley overnight.  Will remove in am and do voiding trials.  Percocet and Toradol for pain.  Regular diet.  Ambulate. Surgical findings and procedure reviewed with patient.  Questions answered.  Anticipate DC in am.   LOS: 0 days    Arloa Koh 04/03/2021, 5:54 PM

## 2021-04-03 NOTE — Transfer of Care (Signed)
Immediate Anesthesia Transfer of Care Note  Patient: Austin Giannotti  Procedure(s) Performed: TOTAL LAPAROSCOPIC HYSTERECTOMY WITH SALPINGECTOMY (Bilateral: Abdomen) TRANSVAGINAL TAPE (TVT) PROCEDURE (Vagina ) CYSTOSCOPY (Bladder)  Patient Location: PACU  Anesthesia Type:General  Level of Consciousness: awake, alert  and oriented  Airway & Oxygen Therapy: Patient Spontanous Breathing and Patient connected to nasal cannula oxygen  Post-op Assessment: Report given to RN and Post -op Vital signs reviewed and stable  Post vital signs: Reviewed and stable  Last Vitals:  Vitals Value Taken Time  BP 128/70 04/03/21 1048  Temp    Pulse 86 04/03/21 1050  Resp 19 04/03/21 1050  SpO2 100 % 04/03/21 1050  Vitals shown include unvalidated device data.  Last Pain:  Vitals:   04/03/21 [id number]  TempSrc: Oral  PainSc: 0-No pain      Patients Stated Pain Goal: 5 ([id number])  Complications: No notable events documented.

## 2021-04-03 NOTE — Anesthesia Postprocedure Evaluation (Signed)
Anesthesia Post Note  Patient: [name]  Procedure(s) Performed: TOTAL LAPAROSCOPIC HYSTERECTOMY WITH SALPINGECTOMY (Bilateral: Abdomen) TRANSVAGINAL TAPE (TVT) PROCEDURE (Vagina ) CYSTOSCOPY (Bladder)     Patient location during evaluation: PACU Anesthesia Type: General Level of consciousness: awake and alert Pain management: pain level controlled Vital Signs Assessment: post-procedure vital signs reviewed and stable Respiratory status: spontaneous breathing, nonlabored ventilation, respiratory function stable and patient connected to nasal cannula oxygen Cardiovascular status: blood pressure returned to baseline and stable Postop Assessment: no apparent nausea or vomiting Anesthetic complications: no   No notable events documented.  Last Vitals:  Vitals:   04/03/21 1140 04/03/21 1202  BP: 136/77 (!) 150/74  Pulse: 91 80  Resp: 18 16  Temp: 36.6 C (!) 36.4 C  SpO2: 100% 100%    Last Pain:  Vitals:   04/03/21 1202  TempSrc: Oral  PainSc:                  [name]

## 2021-04-03 NOTE — Anesthesia Procedure Notes (Signed)
Procedure Name: Intubation Date/Time: 04/03/2021 7:41 AM Performed by: Bufford Spikes, CRNA Pre-anesthesia Checklist: Patient identified, Emergency Drugs available, Suction available and Patient being monitored Patient Re-evaluated:Patient Re-evaluated prior to induction Oxygen Delivery Method: Circle system utilized Preoxygenation: Pre-oxygenation with 100% oxygen Induction Type: IV induction Ventilation: Mask ventilation without difficulty Laryngoscope Size: Miller and 2 Grade View: Grade II Tube type: Oral Tube size: 7.0 mm Number of attempts: 1 Airway Equipment and Method: Stylet and Oral airway Placement Confirmation: ETT inserted through vocal cords under direct vision, positive ETCO2 and breath sounds checked- equal and bilateral Secured at: 21 cm Tube secured with: Tape Dental Injury: Teeth and Oropharynx as per pre-operative assessment

## 2021-04-03 NOTE — Op Note (Addendum)
OPERATIVE REPORT   PREOPERATIVE DIAGNOSIS:  Menorrhagia with irregular menses, fibroids, stress incontinence.    POSTOPERATIVE DIAGNOSIS:   Menorrhagia with irregular menses, fibroids, stress incontinence.      PROCEDURES:  Total laparoscopic hysterectomy with bilateral salpingectomy, TVT Exact midurethral sling, cystoscopy   SURGEON:  Lenard Galloway, M.D.   ASSISTANT:   Dorothy Spark, M.D.   ANESTHESIA:  General endotracheal, intraperitoneal ropivicaine 30 mL diluted in 30 mL of normal saline, local to skin with 0.25% Marcaine, local to vaginal mucosa with 1% lidocaine with epinephrine 1:100,000.  IVF: 800 cc LR.   ESTIMATED BLOOD LOSS:   125 cc.   URINE OUTPUT:  500  cc.   COMPLICATIONS:  None.   INDICATIONS FOR THE PROCEDURE:      The patient is a 45 year old Gravida 17, Para 72 African American female who presents with heavy and irregular menses and urinary incontinence.  Pelvic ultrasound reveals small uterine fibroids.  Urodynamic testing confirmed urinary stress incontinence.  The patient has had a tubal ligation and she declines future childbearing.  She desires surgical treatment for her bleeding and incontinence.   A plan is made to proceed with a total laparoscopic hysterectomy with bilateral salpingectomy, possible bilateral oophorectomy, TVT Exact midurethral sling, and cystoscopy after risks, benefits, and alternatives are reviewed.   FINDINGS:      Exam under anesthesia reveals a small and mobile uterus.  No adnexal masses are noted.   Laparoscopy revealed a 2 cm fundal fibroid and normal bilateral ovaries. The fallopian tubes were consistent with prior tubal ligation.   There were perihepatic adhesions, and the gall bladder was normal.  There was no adhesive disease in the pelvis, and there was no sign of endometriosis.   Cystoscopy at the termination of the procedure showed the bladder to be normal throughout 360 degrees including the bladder dome and trigone. There  was no evidence of any foreign body in the bladder or the urethra. There was no evidence of any lesions of the bladder or the urethra.   Both of the ureters were noted to be patent bilaterally.   SPECIMENS:      The uterus, cervix, and bilateral tubes were went to pathology.    DESCRIPTION OF PROCEDURE:     The patient was reidentified in the preoperative hold area.   She did receive Ciprofloxacin and Flagyl IV for antibiotic prophylaxis.  She received Lovenox, TED hose, and PAS stockings for DVT prophylaxis.   In the operating room, the patient was placed in the dorsal lithotomy position on the operating room table.   Her legs were placed in the Solomon stirrups and her arms were both tucked at her sides. The Trendguard was used to support her shoulders.  The patient received general endotracheal anesthesia. The abdomen and vagina were then sterilely prepped and she was sterilely draped.   A speculum was placed in the vagina and a single-tooth tenaculum was placed on the anterior cervical lip.  A figure-of-eight suture of 0 Vicryl was placed on each the anterior and the posterior cervical lips. The uterus was sounded to 10 cm.  The cervix was then dilated with [facility] dilators.  A 10 RUMI tip with a KOH ring was then placed through the cervix and into the uterine cavity without difficulty.  The remaining vaginal instruments were then removed.  A Foley catheter was placed inside the bladder.   Attention was turned to the abdomen where the umbilical region was injected with  0.25% Marcaine and a small incision created.  A Veress needle was then used to insufflate the abdomen with CO2 gas after a saline drop test was performed and the fluid flowed freely.  A 5 mm umbilical incision was created with a scalpel after the skin.  A 5 mm camera port was then placed using the Optiview.  Am 8 mm incision was created in the left mid abdomen and 5 mm incision was placed in the left lower abdomen after the skin was  injected locally with 0.25% Marcaine.  The respective trocars were then placed under visualization of the laparoscope.  A 5 Airseal trocar was placed in the right lower quadrant after injecting with Marcaine and incising with a scalpel.    Ropivicine 30 cc diluted in 30 cc of normal saline was placed inside the peritoneal cavity.    The patient was placed in Trendelenburg position.  An inspection of the abdomen and pelvis was performed. The findings are as noted above.    The left fallopian tube was grasped and the Ligasure was used to cauterize and cut through the mesosalpinx to the level of the uterus.  The left utero-ovarian ligament was similarly cauterized and cut with the Ligasure.  The left round ligament was then cauterized and divided with same instrument.  Dissection was performed to the anterior and posterior leaves of the broad ligaments using the Harmonic scalpel.  The incision was carried across the anterior cul- de-sac along the vesicouterine fold and the bladder was dissected away from the cervix. The peritoneum was taken down posteriorly.  The left uterine artery was skeletonized at this time using sharp dissection and monopolar cautery.   It was then cauterized and cut with the Ligasure instrument.   Attention was turned to the patient's right-hand side at this time.  The same procedure that was performed on the left side was repeated on the right side with respect to the isolation, cautery, and transection of the vessels and the bladder flap dissection.       The KOH ring was nicely visible.  The colpotomy incision was performed with the Harmonic scalpel in a circumferential fashion. The specimen was then removed from the peritoneal cavity and was sent to Pathology.  The balloon occluder was placed in the vagina.  The vaginal cuff was sutured at this time using a running suture of 0 V-Loc.  The vagina was closed from the patient's right hand side to the left hand side and then back 2  sutures towards the midline.  This provided good full-thickness closure of the vaginal cuff.  The laparoscopic needle for suturing was removed from the peritoneal cavity.   The pelvis was irrigated and suctioned.   The pneumoperitoneal was let down.  There was good hemostasis of the operative sites and pedicles.      The CO2 pneumoperitoneum was released.  The patient received manual breaths to remove any remaining CO2 gas and all trocars were removed.  The trocar sites were closed with subcuticular sutures of 4/0 Vicryl.  Dermabond was placed over all of the incisions.   The vaginal occluder balloon was removed from the vagina.    The patient's Foley catheter was removed and cystoscopy was performed and the findings are as noted above.  The Foley catheter was replaced.   Final inspection of the vagina demonstrated good hemostasis of the vaginal cuff.   The midurethral sling was performed next.   Allis clamps were used to mark the anterior vaginal  wall beginning just below the  urethral meatus and extending down 4 cm.  The anterior vaginal wall mucosa was injected locally with 1% lidocaine with epinephrine, 1:100,000.  The vaginal mucosa was then incised vertically in the midline with the scalpel.  With a combination of sharp and blunt dissection, the subvaginal tissue was dissected off the bladder bilaterally.  The dissection was carried back to the pubic rami anteriorly.   The TVT Exact midurethral sling was performed.  The 1 cm suprapubic incisions were created with a scalpel to the right and left of the midline.  The TVT Exact was performed in a bottom-up fashion.  The Foley catheter was removed and the Foley tip with the obturator guide was placed inside the urethra and deflected properly.  The guide was placed through the right retropubic space and then up through the right suprapubic incision.  This was performed without difficulty.  The urethra was deflected in opposite direction  and the same was then performed on the patient's left-hand side.  The obturator guide was removed and cystoscopy was performed and the findings were as noted above.  All cystoscopic fluid was drained and the Foley catheter was replaced. The sling was brought up through the suprapubic incisions bilaterally. A Kelly clamp was placed between the sling and the urethra, and the plastic sheaths were removed.  The sling was trimmed suprapubically. The sling was noted to be in good position.  There was vaginal bleeding from the locations where the sling arms entered into  The retropubic spaces, and this responded to direct pressure.  Surgiflow was then placed.  Hemostasis was good.   The anterior vaginal wall mucosa was closed with a running locked suture of 2-0 Vicryl.  The suprapubic incisions were closed with Dermabond.   This concluded the patient's procedure. She was extubated and escorted to the recovery room in stable and awake condition.  There were no complications to the procedure.  All needle, instrument, and sponge counts were correct.  An MD assistant was necessary for tissue manipulation, instrumentation, retraction and positioning due to the complexity of the case.   Lenard Galloway, M.D.

## 2021-04-03 NOTE — Progress Notes (Signed)
Dr Quincy Simmonds rounding on patient at this time.

## 2021-04-03 NOTE — Progress Notes (Signed)
Update to History and Physical  Preop Hgb 10.0. Patient examined.  OK to proceed with surgery.

## 2021-04-04 ENCOUNTER — Encounter (HOSPITAL_BASED_OUTPATIENT_CLINIC_OR_DEPARTMENT_OTHER): Payer: Self-pay | Admitting: Obstetrics and Gynecology

## 2021-04-04 DIAGNOSIS — D259 Leiomyoma of uterus, unspecified: Secondary | ICD-10-CM | POA: Diagnosis not present

## 2021-04-04 LAB — CBC
HCT: 30.6 % — ABNORMAL LOW (ref 36.0–46.0)
Hemoglobin: 10 g/dL — ABNORMAL LOW (ref 12.0–15.0)
MCH: 28.5 pg (ref 26.0–34.0)
MCHC: 32.7 g/dL (ref 30.0–36.0)
MCV: 87.2 fL (ref 80.0–100.0)
Platelets: 309 10*3/uL (ref 150–400)
RBC: 3.51 MIL/uL — ABNORMAL LOW (ref 3.87–5.11)
RDW: 14 % (ref 11.5–15.5)
WBC: 13.9 10*3/uL — ABNORMAL HIGH (ref 4.0–10.5)
nRBC: 0 % (ref 0.0–0.2)

## 2021-04-04 LAB — BASIC METABOLIC PANEL
Anion gap: 8 (ref 5–15)
BUN: 11 mg/dL (ref 6–20)
CO2: 22 mmol/L (ref 22–32)
Calcium: 8.9 mg/dL (ref 8.9–10.3)
Chloride: 106 mmol/L (ref 98–111)
Creatinine, Ser: 0.74 mg/dL (ref 0.44–1.00)
GFR, Estimated: >60 mL/min (ref >60)
Glucose, Bld: 129 mg/dL — ABNORMAL HIGH (ref 70–99)
Potassium: 4.3 mmol/L (ref 3.5–5.1)
Sodium: 136 mmol/L (ref 135–145)

## 2021-04-04 LAB — SURGICAL PATHOLOGY

## 2021-04-04 MED ORDER — DIPHENHYDRAMINE HCL 25 MG PO CAPS
25.0000 mg | ORAL_CAPSULE | Freq: Once | ORAL | Status: AC
  Administered 2021-04-04: 25 mg via ORAL

## 2021-04-04 MED ORDER — OXYCODONE-ACETAMINOPHEN 5-325 MG PO TABS: 1.0000 | ORAL_TABLET | ORAL | 0 refills | Status: DC | PRN

## 2021-04-04 MED ORDER — OXYCODONE-ACETAMINOPHEN 5-325 MG PO TABS
ORAL_TABLET | ORAL | Status: AC
  Filled 2021-04-04: qty 2

## 2021-04-04 MED ORDER — KETOROLAC TROMETHAMINE 30 MG/ML IJ SOLN
INTRAMUSCULAR | Status: AC
  Filled 2021-04-04: qty 1

## 2021-04-04 MED ORDER — MENTHOL 3 MG MT LOZG
LOZENGE | OROMUCOSAL | Status: AC
  Filled 2021-04-04: qty 9

## 2021-04-04 MED ORDER — IBUPROFEN 800 MG PO TABS: 800.0000 mg | ORAL_TABLET | Freq: Three times a day (TID) | ORAL | 0 refills | Status: DC | PRN

## 2021-04-04 MED ORDER — DIPHENHYDRAMINE HCL 25 MG PO CAPS
ORAL_CAPSULE | ORAL | Status: AC
  Filled 2021-04-04: qty 1

## 2021-04-04 NOTE — Progress Notes (Signed)
1 Day Post-Op Procedure(s) (LRB): TOTAL LAPAROSCOPIC HYSTERECTOMY WITH SALPINGECTOMY (Bilateral) TRANSVAGINAL TAPE (TVT) PROCEDURE (N/A) CYSTOSCOPY (N/A)  Subjective: Patient reports itching.  Taking Benadryl 25 mg now.  Ambulating.  Eating regular food.  Foley out.  No void yet.  Objective: I have reviewed patient's vitals, input and output, and labs. Vitals:   04/04/21 0226 04/04/21 0605  BP: 125/60 116/60  Pulse: 65 74  Resp:  18  Temp: 98.4 F (36.9 C) 98.7 F (37.1 C)  SpO2: 100% 98%   CBC    Component Value Date/Time   WBC 13.9 (H) 04/04/2021 0214   RBC 3.51 (L) 04/04/2021 0214   HGB 10.0 (L) 04/04/2021 0214   HCT 30.6 (L) 04/04/2021 0214   PLT 309 04/04/2021 0214   MCV 87.2 04/04/2021 0214   MCH 28.5 04/04/2021 0214   MCHC 32.7 04/04/2021 0214   RDW 14.0 04/04/2021 0214     Physical Exam General:  Alert and cooperative.  Lungs:  CTA bilaterally.  Cor:  S1S2 RRR.  Abdomen:  positive bowel sounds, soft and nontender.  Incisions clean, dry and intact.  Vag pad:  dry.  Assessment: s/p Procedure(s): TOTAL LAPAROSCOPIC HYSTERECTOMY WITH SALPINGECTOMY (Bilateral) TRANSVAGINAL TAPE (TVT) PROCEDURE (N/A) CYSTOSCOPY (N/A): progressing well  Plan: Discharge home after completing voiding trial.  Rx for Percocet and Motrin.  Instructions reviewed in verbal and written form. Follow up in one week.   LOS: 0 days    Arloa Koh 04/04/2021, 7:46 AM

## 2021-04-04 NOTE — Discharge Instructions (Signed)
Hi Kathalina,  Please call if you need anything.   I will see you next week in the office.  Josefa Half, MD

## 2021-04-04 NOTE — Progress Notes (Signed)
Patient complaining of itching on her abdomen this morning and requesting Benadryl. MD called and notified. New order received for a one time dose of oral Benadryl 25 mg.

## 2021-04-05 ENCOUNTER — Telehealth: Payer: Self-pay

## 2021-04-05 MED ORDER — HYDROCODONE-ACETAMINOPHEN 5-325 MG PO TABS: 1.0000 | ORAL_TABLET | Freq: Four times a day (QID) | ORAL | 0 refills | Status: DC | PRN

## 2021-04-05 NOTE — Telephone Encounter (Signed)
Had surgery on Monday and was prescribed Percocet for pain. She said it is making her itch all over. She is asking if another pain medication could be called in. She reports her only drug allergies Penicillin and Clindamycin.

## 2021-04-05 NOTE — Telephone Encounter (Signed)
I have just sent in an Rx to her pharmacy for Vicodin. (This is hydrocodone with Tylenol.) She can take 1 by mouth every 6 hours as needed for pain.   I put Percocet on her allergy list.

## 2021-04-05 NOTE — Telephone Encounter (Signed)
Spoke with patient and informed her. °

## 2021-04-10 ENCOUNTER — Ambulatory Visit (INDEPENDENT_AMBULATORY_CARE_PROVIDER_SITE_OTHER): Payer: Commercial Managed Care - PPO | Admitting: Obstetrics and Gynecology

## 2021-04-10 ENCOUNTER — Other Ambulatory Visit: Payer: Self-pay

## 2021-04-10 ENCOUNTER — Encounter: Payer: Self-pay | Admitting: Obstetrics and Gynecology

## 2021-04-10 VITALS — BP 100/62 | HR 68 | Ht 66.0 in | Wt 258.0 lb

## 2021-04-10 DIAGNOSIS — Z90710 Acquired absence of both cervix and uterus: Secondary | ICD-10-CM

## 2021-04-10 DIAGNOSIS — R7309 Other abnormal glucose: Secondary | ICD-10-CM

## 2021-04-10 NOTE — Progress Notes (Signed)
GYNECOLOGY  VISIT   HPI: 45 y.o.   Married  [place] American  female   No obstetric history on file. with Patient's last menstrual period was 03/15/2021 (approximate).   here for 1 week status post TOTAL LAPAROSCOPIC HYSTERECTOMY WITH SALPINGECTOMY (Bilateral: Abdomen)  TRANSVAGINAL TAPE (TVT) PROCEDURE (Vagina )  CYSTOSCOPY (Bladder).  Voiding well but more slowly.  Taking ibuprofen only.   She had itching with Percocet and Vicodin.  No rash with either.  Still bleeding vaginally, the size of a quarter daily.  Using Colace and Miralax daily.   Some tightness in the vagina.   Had elevated glucose preop op and anemia post op.   No light duty option at work.   GYNECOLOGIC HISTORY: Patient's last menstrual period was 03/15/2021 (approximate). Contraception: Tubal/Hyst Menopausal hormone therapy:  none Last mammogram:  Fall 2020-normal per patient. Thinks it was done last year at [place], [place].  Last pap smear:   08-11-20 Neg:Neg HR HPV        OB History   No obstetric history on file.        Patient Active Problem List   Diagnosis Date Noted   Status post laparoscopic hysterectomy 04/03/2021   Menorrhagia with irregular cycle 02/05/2021   Fibroids 02/05/2021   Urinary, incontinence, stress female 01/06/2021    Past Medical History:  Diagnosis Date   Cervical herniated disc    left C 6 - C7 reports pinched nerve also on right side   COVID 07/2019   headache n/v diarrhea, loss of taste and smell all symptoms resolved   Superficial thrombosis of leg, right 2007   from birth control pills   Vitamin D deficiency 03/29/2021   Wears contact lenses 03/29/2021   Wears glasses 03/29/2021    Past Surgical History:  Procedure Laterality Date   BLADDER SUSPENSION N/A 04/03/2021   Procedure: TRANSVAGINAL TAPE (TVT) PROCEDURE;  Surgeon: Nunzio Cobbs, MD;  Location: [facility];  Service: Gynecology;  Laterality: N/A;   CYSTOSCOPY N/A  04/03/2021   Procedure: CYSTOSCOPY;  Surgeon: Nunzio Cobbs, MD;  Location[facility];  Service: Gynecology;  Laterality: N/A;   TOTAL LAPAROSCOPIC HYSTERECTOMY WITH SALPINGECTOMY Bilateral 04/03/2021   Procedure: TOTAL LAPAROSCOPIC HYSTERECTOMY WITH SALPINGECTOMY;  Surgeon: Nunzio Cobbs, MD;  Location: [facility];  Service: Gynecology;  Laterality: Bilateral;   TUBAL LIGATION     18 to 17 yrs ago per pt on 03-29-2021    Current Outpatient Medications  Medication Sig Dispense Refill   Ascorbic Acid (VITAMIN C PO) Take 1 tablet by mouth daily.     Cyanocobalamin (VITAMIN B 12 PO) Take 1 tablet by mouth daily.     ibuprofen (ADVIL) 800 MG tablet Take 1 tablet (800 mg total) by mouth every 8 (eight) hours as needed for mild pain. 30 tablet 0   Multiple Vitamins-Minerals (ZINC PO) Take 1 tablet by mouth daily. Mvi qd     triamcinolone (KENALOG) 0.025 % ointment Apply 1 application topically 2 (two) times daily. Use as needed. 30 g 1   Vitamin D, Ergocalciferol, (DRISDOL) 1.25 MG (50000 UNIT) CAPS capsule Take 50,000 Units by mouth once a week. tuesdays     No current facility-administered medications for this visit.     ALLERGIES: Cinnamon, Clindamycin/lincomycin, Hydrocodone, Penicillins, and Percocet [oxycodone-acetaminophen]  Family History  Problem Relation Age of Onset   Breast cancer Mother    Diabetes Mother  Hypertension Mother    Hyperlipidemia Mother    Hypertension Father    Hyperlipidemia Father    Diabetes Father    Heart disease Father    Rheum arthritis Father    Hypertension Sister    Breast cancer Paternal Aunt     Social History   Socioeconomic History   Marital status: Married    Spouse name: Not on file   Number of children: Not on file   Years of education: Not on file   Highest education level: Not on file  Occupational History   Not on file  Tobacco Use   Smoking status: Never   Smokeless  tobacco: Never  Vaping Use   Vaping Use: Never used  Substance and Sexual Activity   Alcohol use: Not Currently   Drug use: Never   Sexual activity: Yes    Birth control/protection: Surgical    Comment: BTL  Other Topics Concern   Not on file  Social History Narrative   Not on file   Social Determinants of Health   Financial Resource Strain: Not on file  Food Insecurity: Not on file  Transportation Needs: Not on file  Physical Activity: Not on file  Stress: Not on file  Social Connections: Not on file  Intimate Partner Violence: Not on file    Review of Systems  All other systems reviewed and are negative.  PHYSICAL EXAMINATION:    BP 100/62   Pulse 68   Ht '5\' 6"'$  (1.676 m)   Wt 258 lb (117 kg)   LMP 03/15/2021 (Approximate)   SpO2 100%   BMI 41.64 kg/m     General appearance: alert, cooperative and appears stated age  Abdomen: incisions intact, abdomen is soft, non-tender, no masses,  no organomegaly.  Pelvic: External genitalia:  SP incisions intact              Urethra:  normal appearing urethra with no masses, tenderness or lesions              Bartholins and Skenes: normal                 Vagina: suture line intact anteriorly and at cuff.              Cervix: absent                Bimanual Exam:  Uterus:  absent              Adnexa: no mass, fullness, tenderness  Chaperone was present for exam:  yes.  ASSESSMENT  Status post total laparoscopic hysterectomy with bilateral salpingectomy, TVT midurethral sling, cystoscopy.  Doing well post op. Elevated glucose.  Anemia.  PLAN  Check A1C and CBC.  Continue decreased activity and no lifting over 10 pounds for 12 weeks post op. No work for 12 weeks post op. Then return to normal duty. FU here for 6 week post op check.   An After Visit Summary was printed and given to the patient.

## 2021-04-11 ENCOUNTER — Encounter: Payer: Self-pay | Admitting: Obstetrics and Gynecology

## 2021-04-11 ENCOUNTER — Other Ambulatory Visit (INDEPENDENT_AMBULATORY_CARE_PROVIDER_SITE_OTHER): Payer: Self-pay | Admitting: Obstetrics and Gynecology

## 2021-04-11 LAB — CBC
HCT: 34 % — ABNORMAL LOW (ref 35.0–45.0)
Hemoglobin: 11.1 g/dL — ABNORMAL LOW (ref 11.7–15.5)
MCH: 28 pg (ref 27.0–33.0)
MCHC: 32.6 g/dL (ref 32.0–36.0)
MCV: 85.9 fL (ref 80.0–100.0)
MPV: 9.6 fL (ref 7.5–12.5)
Platelets: 369 10*3/uL (ref 140–400)
RBC: 3.96 10*6/uL (ref 3.80–5.10)
RDW: 13.4 % (ref 11.0–15.0)
WBC: 8.1 10*3/uL (ref 3.8–10.8)

## 2021-04-11 LAB — HEMOGLOBIN A1C
Hgb A1c MFr Bld: 6.1 % of total Hgb — ABNORMAL HIGH (ref <5.7)
Mean Plasma Glucose: 128 mg/dL
eAG (mmol/L): 7.1 mmol/L

## 2021-04-12 ENCOUNTER — Other Ambulatory Visit: Payer: Self-pay | Admitting: Obstetrics and Gynecology

## 2021-04-12 MED ORDER — IBUPROFEN 800 MG PO TABS: 800.0000 mg | ORAL_TABLET | Freq: Three times a day (TID) | ORAL | 0 refills | Status: DC | PRN

## 2021-04-12 NOTE — Telephone Encounter (Signed)
Post TLH 04/03/21. Patient received #30 on 04/04/21 and said she is taking every 8 hours. She said she runs out tomorrow and requesting refill. Said she is unable to take the narcotic Rx.

## 2021-04-16 NOTE — Discharge Summary (Signed)
Physician Discharge Summary  Patient ID: Kathryn Duncan MRN: [id number] DOB/AGE: [birth year] 45 y.o.  Admit date: 04/03/2021 Discharge date: 04/04/21  Admission Diagnoses: Menorrhagia with irregular menses.  2.   Fibroids. 3.   Stress urinary incontinence.   Discharge Diagnoses:  Menorrhagia with irregular menses.  2.   Fibroids. 3.   Stress urinary incontinence.  4.   Status post total laparoscopic hysterectomy with bilateral salpingectomy, TVT Exact midurethral sling, cystoscopy on 04/03/21.  Active Problems:   Status post laparoscopic hysterectomy   Discharged Condition: good  Hospital Course:  The patient was admitted on 04/03/21 for a total laparoscopic hysterectomy with bilateral salpingectomy, TVT Exact midurethral sling, cystoscopy which were performed without complication while under general anesthesia.  The patient's post op course was uneventful.  She had a morphine and Toradol IV for pain control initially, and this was converted over to Percocet and Motrin when the patient began taking po well.  She ambulated independently and wore PAS and Ted hose for DVT prophylaxis while in bed.  She received Lovenox preop for DVT prophylaxis.  Her foley catheter were removed on post op day one, and she voided well and good volumes. The patient's vital signs remained stable and she demonstrated no signs of infection during her hospitalization.  The patient's post op day one Hgb was 10.0.  She was tolerating the this well.  She had very minimal vaginal bleeding, and her incision(s) demonstrated no signs of erythema or significant drainage.  She was able to void spontaneously and well after her Foley catheter was removed on post op day one.  She was found to be in good condition and ready for discharge on post op day one.  Consults: none.  Significant Diagnostic Studies: See Hospital Course.  Treatments: surgery: total laparoscopic hysterectomy with bilateral salpingectomy, TVT Exact midurethral  sling, cystoscopy on 04/03/21.  Discharge Exam: Blood pressure 124/62, pulse 71, temperature 98.3 F (36.8 C), resp. rate 18, height 5\' 6"  (1.676 m), weight 117.9 kg, last menstrual period 03/15/2021, SpO2 98 %. General:  alert and cooperative.  Lungs:  CTA bilaterally.  Cor:  S1S2 RRR.  Abdomen:  positive bowel sounds, soft and nontender.  Incisions clean, dry and intact.  Vag pad:  dry.  Disposition: Discharge disposition: 01-Home or Self Care     Discharge instructions were reviewed in verbal and written form.  Discharge Instructions     Discharge patient   Complete by: As directed    Discharge disposition: 01-Home or Self Care   Discharge patient date: 04/04/2021      Allergies as of 04/04/2021       Reactions   Cinnamon Itching   Clindamycin/lincomycin Itching   Penicillins Hives        Medication List     STOP taking these medications    GOODY HEADACHE PO       TAKE these medications    triamcinolone 0.025 % ointment Commonly known as: KENALOG Apply 1 application topically 2 (two) times daily. Use as needed.   VITAMIN B 12 PO Take 1 tablet by mouth daily.   VITAMIN C PO Take 1 tablet by mouth daily.   Vitamin D (Ergocalciferol) 1.25 MG (50000 UNIT) Caps capsule Commonly known as: DRISDOL Take 50,000 Units by mouth once a week. tuesdays   ZINC PO Take 1 tablet by mouth daily. Mvi qd      Percocet 5 mg/325 mg, take 1 - 2 by mouth every 4 - 6 hours as needed for pain.  Motrin 800 mg by mouth every 8 hours as needed for pain.   Follow up in the office in one week.   Signed: Arloa Koh 04/16/2021, 2:06 PM

## 2021-05-03 ENCOUNTER — Other Ambulatory Visit: Payer: Self-pay | Admitting: Obstetrics and Gynecology

## 2021-05-04 NOTE — Telephone Encounter (Signed)
Post TLH 04/03/21 Ibuprofen 800mg  #60 prescribed 04/12/21.

## 2021-05-15 ENCOUNTER — Encounter: Payer: Self-pay | Admitting: Obstetrics and Gynecology

## 2021-05-15 ENCOUNTER — Other Ambulatory Visit: Payer: Self-pay

## 2021-05-15 ENCOUNTER — Encounter: Payer: Commercial Managed Care - PPO | Admitting: Obstetrics and Gynecology

## 2021-05-15 ENCOUNTER — Ambulatory Visit (INDEPENDENT_AMBULATORY_CARE_PROVIDER_SITE_OTHER): Payer: Commercial Managed Care - PPO | Admitting: Obstetrics and Gynecology

## 2021-05-15 VITALS — BP 124/76 | HR 84 | Ht 66.0 in | Wt 260.0 lb

## 2021-05-15 DIAGNOSIS — Z90710 Acquired absence of both cervix and uterus: Secondary | ICD-10-CM

## 2021-05-15 NOTE — Progress Notes (Signed)
GYNECOLOGY  VISIT   HPI: 45 y.o.   Married  [place] American  female   No obstetric history on file. with Patient's last menstrual period was 03/15/2021 (approximate).   here for 6 weeks status post    TOTAL LAPAROSCOPIC HYSTERECTOMY WITH SALPINGECTOMY (Bilateral: Abdomen)  TRANSVAGINAL TAPE (TVT) PROCEDURE (Vagina )  CYSTOSCOPY (Bladder).  Feels like she can feel her ovaries from time to time.   Some vaginal spotting 2 weeks ago, only with wiping and was light.  A little bit of yellow discharge.   Energy level is improved.   Bladder control is better.  Slower flowing urine.  No leak with cough or sneeze.   A1C 6.1 on 04/10/21.   GYNECOLOGIC HISTORY: Patient's last menstrual period was 03/15/2021 (approximate). Contraception: Tubal/Hyst Menopausal hormone therapy:  none Last mammogram: Fall 2020-normal per patient. Thinks it was done last year at [place], [place].  Last pap smear:  08-11-20 Neg:Neg HR HPV        OB History   No obstetric history on file.        Patient Active Problem List   Diagnosis Date Noted   Status post laparoscopic hysterectomy 04/03/2021   Menorrhagia with irregular cycle 02/05/2021   Fibroids 02/05/2021   Urinary, incontinence, stress female 01/06/2021    Past Medical History:  Diagnosis Date   Cervical herniated disc    left C 6 - C7 reports pinched nerve also on right side   COVID 07/2019   headache n/v diarrhea, loss of taste and smell all symptoms resolved   Elevated hemoglobin A1c 03/2021   6.1   Superficial thrombosis of leg, right 2007   from birth control pills   Vitamin D deficiency 03/29/2021   Wears contact lenses 03/29/2021   Wears glasses 03/29/2021    Past Surgical History:  Procedure Laterality Date   BLADDER SUSPENSION N/A 04/03/2021   Procedure: TRANSVAGINAL TAPE (TVT) PROCEDURE;  Surgeon: Nunzio Cobbs, MD;  Location: [facility];  Service: Gynecology;  Laterality: N/A;    CYSTOSCOPY N/A 04/03/2021   Procedure: CYSTOSCOPY;  Surgeon: Nunzio Cobbs, MD;  Location: [facility];  Service: Gynecology;  Laterality: N/A;   TOTAL LAPAROSCOPIC HYSTERECTOMY WITH SALPINGECTOMY Bilateral 04/03/2021   Procedure: TOTAL LAPAROSCOPIC HYSTERECTOMY WITH SALPINGECTOMY;  Surgeon: Nunzio Cobbs, MD;  Location: [facility];  Service: Gynecology;  Laterality: Bilateral;   TUBAL LIGATION     18 to 17 yrs ago per pt on 03-29-2021    Current Outpatient Medications  Medication Sig Dispense Refill   Ascorbic Acid (VITAMIN C PO) Take 1 tablet by mouth daily.     Cyanocobalamin (VITAMIN B 12 PO) Take 1 tablet by mouth daily.     Multiple Vitamins-Minerals (ZINC PO) Take 1 tablet by mouth daily. Mvi qd (Patient not taking: Reported on 05/15/2021)     No current facility-administered medications for this visit.     ALLERGIES: Cinnamon, Clindamycin/lincomycin, Hydrocodone, Penicillins, and Percocet [oxycodone-acetaminophen]  Family History  Problem Relation Age of Onset   Breast cancer Mother    Diabetes Mother    Hypertension Mother    Hyperlipidemia Mother    Hypertension Father    Hyperlipidemia Father    Diabetes Father    Heart disease Father    Rheum arthritis Father    Hypertension Sister    Breast cancer Paternal Aunt     Social History   Socioeconomic History   Marital  status: Married    Spouse name: Not on file   Number of children: Not on file   Years of education: Not on file   Highest education level: Not on file  Occupational History   Not on file  Tobacco Use   Smoking status: Never   Smokeless tobacco: Never  Vaping Use   Vaping Use: Never used  Substance and Sexual Activity   Alcohol use: Not Currently   Drug use: Never   Sexual activity: Yes    Birth control/protection: Surgical    Comment: BTL  Other Topics Concern   Not on file  Social History Narrative   Not on file   Social Determinants  of Health   Financial Resource Strain: Not on file  Food Insecurity: Not on file  Transportation Needs: Not on file  Physical Activity: Not on file  Stress: Not on file  Social Connections: Not on file  Intimate Partner Violence: Not on file    Review of Systems  All other systems reviewed and are negative.  PHYSICAL EXAMINATION:    BP 124/76   Pulse 84   Ht 5\' 6"  (1.676 m)   Wt 260 lb (117.9 kg)   LMP 03/15/2021 (Approximate)   SpO2 98%   BMI 41.97 kg/m     General appearance: alert, cooperative and appears stated age  Abdomen: incisions intact.  Abdomen is soft, non-tender, no masses,  no organomegaly SP incisions not visible today.  Pelvic: External genitalia:  no lesions              Urethra:  normal appearing urethra with no masses, tenderness or lesions              Bartholins and Skenes: normal                 Vagina: normal appearing vagina with normal color and discharge, no lesions              Cervix: absent. Cuff intact.                 Bimanual Exam:  Uterus:  absent.               Adnexa: no mass, fullness, tenderness    Chaperone was present for exam:  yes.  ASSESSMENT  Status post laparoscopic hysterectomy with bilateral salpingectomy, TVT midurethral sling, cystoscopy.  Doing well overall.  Elevated A1C in a prediabetic range.   PLAN  Continue decreased activity.  FU for 12 week post op visit before returning back to work.  We discussed a diet low in sugar and carbohydrate in order to control her blood sugar.  Walking is ok but no significant exercise until 12 weeks post op. She will see her PCP in December and do follow up of her elevated A1C.   An After Visit Summary was printed and given to the patient.

## 2021-06-19 ENCOUNTER — Ambulatory Visit: Payer: Commercial Managed Care - PPO | Admitting: Obstetrics and Gynecology

## 2021-06-20 ENCOUNTER — Telehealth: Payer: Self-pay

## 2021-06-20 ENCOUNTER — Encounter: Payer: Commercial Managed Care - PPO | Admitting: Obstetrics and Gynecology

## 2021-06-20 MED ORDER — FLUCONAZOLE 150 MG PO TABS: 150.0000 mg | ORAL_TABLET | Freq: Once | ORAL | 0 refills | Status: AC

## 2021-06-20 NOTE — Telephone Encounter (Signed)
Patient with [facility], SE on 04/03/2021.  C/o recent antibiotic use and now with yeast infection. C/o irritation, vag itching, "cottage cheese" like discharge. No odor. She requests Diflucan tablet as Dr. Quincy Simmonds told her not to use anything in vagina.

## 2021-06-20 NOTE — Telephone Encounter (Signed)
Patient informed. 

## 2021-06-20 NOTE — Telephone Encounter (Signed)
One diflucan tablet sent. She should be seen if she isn't feeling better in a few days.

## 2021-06-21 ENCOUNTER — Ambulatory Visit: Payer: Commercial Managed Care - PPO | Admitting: Obstetrics and Gynecology

## 2021-06-21 NOTE — Progress Notes (Addendum)
GYNECOLOGY  VISIT   HPI: 45 y.o.   Married  [place] American  female   G4L4  Patient's last menstrual period was 03/15/2021 (approximate).   here for status post 12 weeks   TOTAL LAPAROSCOPIC HYSTERECTOMY WITH SALPINGECTOMY (Bilateral: Abdomen)  TRANSVAGINAL TAPE (TVT) PROCEDURE (Vagina )  CYSTOSCOPY (Bladder).  She feels some discomfort suprapubic area.   Some urgency related urinary leakage.  This occurs from time to time.  Has urgency when she gets close the bathroom.   Took Diflucan following Strep through treatment.  Yeast infection symptoms are now gone.   Having hot flashes.  GYNECOLOGIC HISTORY: Patient's last menstrual period was 03/15/2021 (approximate). Contraception:  tubal/Hyst/Bil.Salpingectomy Menopausal hormone therapy:  none Last mammogram:  Fall 2020-normal per patient. Thinks it was done last year at [place], [place].  Last pap smear: 08-11-20 Neg:Neg HR HPV         OB History     Gravida  4   Para      Term      Preterm      AB      Living  4      SAB      IAB      Ectopic      Multiple      Live Births  4              Patient Active Problem List   Diagnosis Date Noted   Status post laparoscopic hysterectomy 04/03/2021   Menorrhagia with irregular cycle 02/05/2021   Fibroids 02/05/2021   Urinary, incontinence, stress female 01/06/2021    Past Medical History:  Diagnosis Date   Cervical herniated disc    left C 6 - C7 reports pinched nerve also on right side   COVID 07/2019   headache n/v diarrhea, loss of taste and smell all symptoms resolved   Elevated hemoglobin A1c 03/2021   6.1   Superficial thrombosis of leg, right 2007   from birth control pills   Vitamin D deficiency 03/29/2021   Wears contact lenses 03/29/2021   Wears glasses 03/29/2021    Past Surgical History:  Procedure Laterality Date   BLADDER SUSPENSION N/A 04/03/2021   Procedure: TRANSVAGINAL TAPE (TVT) PROCEDURE;  Surgeon: Nunzio Cobbs, MD;  Location: [place];  Service: Gynecology;  Laterality: N/A;   CYSTOSCOPY N/A 04/03/2021   Procedure: CYSTOSCOPY;  Surgeon: Nunzio Cobbs, MD;  Location: [facility];  Service: Gynecology;  Laterality: N/A;   TOTAL LAPAROSCOPIC HYSTERECTOMY WITH SALPINGECTOMY Bilateral 04/03/2021   Procedure: TOTAL LAPAROSCOPIC HYSTERECTOMY WITH SALPINGECTOMY;  Surgeon: Nunzio Cobbs, MD;  Location: [facility];  Service: Gynecology;  Laterality: Bilateral;   TUBAL LIGATION     16 to 19 yrs ago per pt on 03-29-2021    No current outpatient medications on file.   No current facility-administered medications for this visit.     ALLERGIES: Cinnamon, Clindamycin/lincomycin, Hydrocodone, Penicillins, and Percocet [oxycodone-acetaminophen]  Family History  Problem Relation Age of Onset   Breast cancer Mother    Diabetes Mother    Hypertension Mother    Hyperlipidemia Mother    Hypertension Father    Hyperlipidemia Father    Diabetes Father    Heart disease Father    Rheum arthritis Father    Hypertension Sister    Breast cancer Paternal Aunt     Social History   Socioeconomic History   Marital status: Married  Spouse name: Not on file   Number of children: Not on file   Years of education: Not on file   Highest education level: Not on file  Occupational History   Not on file  Tobacco Use   Smoking status: Never   Smokeless tobacco: Never  Vaping Use   Vaping Use: Never used  Substance and Sexual Activity   Alcohol use: Not Currently   Drug use: Never   Sexual activity: Yes    Birth control/protection: Surgical    Comment: BTL, hysterectomy (not since surgery)  Other Topics Concern   Not on file  Social History Narrative   Not on file   Social Determinants of Health   Financial Resource Strain: Not on file  Food Insecurity: Not on file  Transportation Needs: Not on file  Physical Activity:  Not on file  Stress: Not on file  Social Connections: Not on file  Intimate Partner Violence: Not on file    Review of Systems  Constitutional: Negative.   HENT: Negative.    Eyes: Negative.   Respiratory: Negative.    Cardiovascular: Negative.   Gastrointestinal: Negative.   Endocrine: Negative.   Genitourinary: Negative.   Musculoskeletal: Negative.   Skin: Negative.   Allergic/Immunologic: Negative.   Neurological: Negative.   Hematological: Negative.   Psychiatric/Behavioral: Negative.     PHYSICAL EXAMINATION:    BP 110/70   Pulse 98   Resp 16   Wt 265 lb (120.2 kg)   LMP 03/15/2021 (Approximate)   BMI 42.77 kg/m     General appearance: alert, cooperative and appears stated age  Abdomen: incisions are well healed.  Abdomen is soft, non-tender, no masses,  no organomegaly   Pelvic: External genitalia:  no lesions.  SP incisions well healed.              Urethra:  normal appearing urethra with no masses, tenderness or lesions              Bartholins and Skenes: normal                 Vagina: normal appearing vagina with normal color and discharge, no lesions.  Sling is well protected.              Cervix: absent.  Vaginal cuff intact.  Small amount of suture at right vaginal cuff.                  Bimanual Exam:  Uterus:  asent              Adnexa: no mass, fullness, tenderness            Chaperone was present for exam:  yes.  ASSESSMENT  Status post total laparoscopic hysterectomy with bilateral salpingectomy, TVT midurethral sling, cystoscopy.   Doing well following surgery.  Perimenopause.  PLAN  Ok to return to unrestricted work.  Wait one more week to begin sexual activity.  Estroven and cooking oils for sexual activity. We discussed herbal treatment for vasomotor symptoms.  She will let me know if she needs prescription medication such as SSRI/SNRI, Gabapentin, or ERT for symptoms.  She will follow up with her PCP regarding her A1C.  Fu in 6 months  for annual exam.    An After Visit Summary was printed and given to the patient.  Addendum:  Patient has had a superficial thrombosis of her leg.  Will plan to avoid estrogen therapy.

## 2021-06-28 ENCOUNTER — Ambulatory Visit (INDEPENDENT_AMBULATORY_CARE_PROVIDER_SITE_OTHER): Payer: Commercial Managed Care - PPO | Admitting: Obstetrics and Gynecology

## 2021-06-28 ENCOUNTER — Encounter: Payer: Self-pay | Admitting: Obstetrics and Gynecology

## 2021-06-28 ENCOUNTER — Other Ambulatory Visit: Payer: Self-pay

## 2021-06-28 VITALS — BP 110/70 | HR 98 | Resp 16 | Wt 265.0 lb

## 2021-06-28 DIAGNOSIS — Z9889 Other specified postprocedural states: Secondary | ICD-10-CM

## 2021-12-21 NOTE — Progress Notes (Signed)
46 y.o. G20P0 Married [place] American female here for annual exam.    Having hot flashes and having difficulty sleeping due to heat.  Night time symptoms are the worst.   Has some urge to void sometimes.  No leakage of urine with cough, laugh or sneeze.   No off her antiHTN.  PCP:  Raechel Chute, [facility] in [place].   Patient's last menstrual period was 03/15/2021 (approximate).           Sexually active: Yes.    The current method of family planning is tubal ligation/Hyst.    Exercising: No.  The patient does not participate in regular exercise at present. Smoker:  no  Health Maintenance: Pap:  ~2020 normal per patient, 08-19-20 Neg:Neg HR HPv History of abnormal Pap:  Yes, Hx of colposcopy in the past, but no treatment. MMG:  Fall of 2020 normal per patient in [place], [place].  She will schedule.  Colonoscopy:  n/a BMD:   n/a  Result  /a TDaP:  UTD Gardasil:   no HIV: Neg in the past Hep C: no Screening Labs:  PCP   reports that she has never smoked. She has never used smokeless tobacco. She reports that she does not currently use alcohol. She reports that she does not use drugs.  Past Medical History:  Diagnosis Date   Cervical herniated disc    left C 6 - C7 reports pinched nerve also on right side   COVID 07/2019   headache n/v diarrhea, loss of taste and smell all symptoms resolved   Elevated hemoglobin A1c 03/2021   6.1   Superficial thrombosis of leg, right 2007   from birth control pills   Vitamin D deficiency 03/29/2021   Wears contact lenses 03/29/2021   Wears glasses 03/29/2021    Past Surgical History:  Procedure Laterality Date   BLADDER SUSPENSION N/A 04/03/2021   Procedure: TRANSVAGINAL TAPE (TVT) PROCEDURE;  Surgeon: Nunzio Cobbs, MD;  Location: [facility];  Service: Gynecology;  Laterality: N/A;   CYSTOSCOPY N/A 04/03/2021   Procedure: CYSTOSCOPY;  Surgeon: Nunzio Cobbs, MD;  Location: [facility];  Service: Gynecology;  Laterality: N/A;   TOTAL LAPAROSCOPIC HYSTERECTOMY WITH SALPINGECTOMY Bilateral 04/03/2021   Procedure: TOTAL LAPAROSCOPIC HYSTERECTOMY WITH SALPINGECTOMY;  Surgeon: Nunzio Cobbs, MD;  Location: [facility];  Service: Gynecology;  Laterality: Bilateral;   TUBAL LIGATION     18 to 19 yrs ago per pt on 03-29-2021    Current Outpatient Medications  Medication Sig Dispense Refill   Vitamin D, Ergocalciferol, (DRISDOL) 1.25 MG (50000 UNIT) CAPS capsule Take 50,000 Units by mouth once a week.     No current facility-administered medications for this visit.    Family History  Problem Relation Age of Onset   Breast cancer Mother    Diabetes Mother    Hypertension Mother    Hyperlipidemia Mother    Hypertension Father    Hyperlipidemia Father    Diabetes Father    Heart disease Father    Rheum arthritis Father    Hypertension Sister    Breast cancer Paternal Aunt     Review of Systems  Constitutional:        Hot flashes  All other systems reviewed and are negative.  Exam:   BP 120/72   Pulse (!) 58   Ht '5\' 5"'$  (1.651 m)   Wt 257 lb (116.6 kg)  LMP 03/15/2021 (Approximate)   SpO2 98%   BMI 42.77 kg/m     General appearance: alert, cooperative and appears stated age Head: normocephalic, without obvious abnormality, atraumatic Neck: no adenopathy, supple, symmetrical, trachea midline and thyroid normal to inspection and palpation Lungs: clear to auscultation bilaterally Breasts: normal appearance, no masses or tenderness, No nipple retraction or dimpling, No nipple discharge or bleeding, No axillary adenopathy Heart: regular rate and rhythm Abdomen: soft, non-tender; no masses, no organomegaly Extremities: extremities normal, atraumatic, no cyanosis or edema Skin: skin color, texture, turgor normal. No rashes or lesions Lymph nodes: cervical, supraclavicular, and axillary nodes normal. Neurologic: grossly  normal  Pelvic: External genitalia:  no lesions              No abnormal inguinal nodes palpated.              Urethra:  normal appearing urethra with no masses, tenderness or lesions              Bartholins and Skenes: normal                 Vagina: normal appearing vagina with normal color and discharge, no lesions.  Rugae noted.               Cervix: absent              Pap taken: no Bimanual Exam:  Uterus:  absent              Adnexa: no mass, fullness, tenderness              Rectal exam: yes.  Confirms.              Anus:  normal sphincter tone, no lesions  Chaperone was present for exam:  Estill Bamberg, CMA  Assessment:   Well woman visit with gynecologic exam. Status post total laparoscopic hysterectomy with bilateral salpingectomy, TVT Exact midurethral sling, cystoscopy. Menopausal symptoms.  FH breast cancer.  Hx superficial thrombus right leg post partum.  Hx elevated A1C.   Plan: Mammogram screening discussed.  She will schedule. Self breast awareness reviewed. Pap and HR HPV as above. Guidelines for Calcium, Vitamin D, regular exercise program including cardiovascular and weight bearing exercise. Check FSH and estradiol.  Will start Gabapentin if labs confirm menopause.  Side effects discussed.  Goal would be to work up to dosage of 300 mg q hs.  Follow up annually and prn.   After visit summary provided.

## 2021-12-25 ENCOUNTER — Ambulatory Visit
Admission: RE | Admit: 2021-12-25 | Discharge: 2021-12-25 | Disposition: A | Discharge status: Home / Self Care | Payer: Commercial Managed Care - PPO | Source: Ambulatory Visit | Attending: Obstetrics and Gynecology | Admitting: Obstetrics and Gynecology

## 2021-12-25 ENCOUNTER — Ambulatory Visit (INDEPENDENT_AMBULATORY_CARE_PROVIDER_SITE_OTHER): Payer: Commercial Managed Care - PPO | Admitting: Obstetrics and Gynecology

## 2021-12-25 ENCOUNTER — Other Ambulatory Visit: Payer: Self-pay | Admitting: Obstetrics and Gynecology

## 2021-12-25 ENCOUNTER — Encounter: Payer: Self-pay | Admitting: Obstetrics and Gynecology

## 2021-12-25 VITALS — BP 120/72 | HR 58 | Ht 65.0 in | Wt 257.0 lb

## 2021-12-25 DIAGNOSIS — Z01419 Encounter for gynecological examination (general) (routine) without abnormal findings: Secondary | ICD-10-CM | POA: Diagnosis not present

## 2021-12-25 DIAGNOSIS — N951 Menopausal and female climacteric states: Secondary | ICD-10-CM

## 2021-12-25 DIAGNOSIS — Z1231 Encounter for screening mammogram for malignant neoplasm of breast: Secondary | ICD-10-CM

## 2021-12-25 NOTE — Patient Instructions (Addendum)
EXERCISE AND DIET:  We recommended that you start or continue a regular exercise program for good health. Regular exercise means any activity that makes your heart beat faster and makes you sweat.  We recommend exercising at least 30 minutes per day at least 3 days a week, preferably 4 or 5.  We also recommend a diet low in fat and sugar.  Inactivity, poor dietary choices and obesity can cause diabetes, heart attack, stroke, and kidney damage, among others.    ALCOHOL AND SMOKING:  Women should limit their alcohol intake to no more than 7 drinks/beers/glasses of wine (combined, not each!) per week. Moderation of alcohol intake to this level decreases your risk of breast cancer and liver damage. And of course, no recreational drugs are part of a healthy lifestyle.  And absolutely no smoking or even second hand smoke. Most people know smoking can cause heart and lung diseases, but did you know it also contributes to weakening of your bones? Aging of your skin?  Yellowing of your teeth and nails?  CALCIUM AND VITAMIN D:  Adequate intake of calcium and Vitamin D are recommended.  The recommendations for exact amounts of these supplements seem to change often, but generally speaking 600 mg of calcium (either carbonate or citrate) and 800 units of Vitamin D per day seems prudent. Certain women may benefit from higher intake of Vitamin D.  If you are among these women, your doctor will have told you during your visit.    PAP SMEARS:  Pap smears, to check for cervical cancer or precancers,  have traditionally been done yearly, although recent scientific advances have shown that most women can have pap smears less often.  However, every woman still should have a physical exam from her gynecologist every year. It will include a breast check, inspection of the vulva and vagina to check for abnormal growths or skin changes, a visual exam of the cervix, and then an exam to evaluate the size and shape of the uterus and  ovaries.  And after 44 years of age, a rectal exam is indicated to check for rectal cancers. We will also provide age appropriate advice regarding health maintenance, like when you should have certain vaccines, screening for sexually transmitted diseases, bone density testing, colonoscopy, mammograms, etc.   MAMMOGRAMS:  All women over 40 years old should have a yearly mammogram. Many facilities now offer a "3D" mammogram, which may cost around $50 extra out of pocket. If possible,  we recommend you accept the option to have the 3D mammogram performed.  It both reduces the number of women who will be called back for extra views which then turn out to be normal, and it is better than the routine mammogram at detecting truly abnormal areas.    COLONOSCOPY:  Colonoscopy to screen for colon cancer is recommended for all women at age 50.  We know, you hate the idea of the prep.  We agree, BUT, having colon cancer and not knowing it is worse!!  Colon cancer so often starts as a polyp that can be seen and removed at colonscopy, which can quite literally save your life!  And if your first colonoscopy is normal and you have no family history of colon cancer, most women don't have to have it again for 10 years.  Once every ten years, you can do something that may end up saving your life, right?  We will be happy to help you get it scheduled when you are ready.    Be sure to check your insurance coverage so you understand how much it will cost.  It may be covered as a preventative service at no cost, but you should check your particular policy.    Gabapentin Capsules or Tablets What is this medication? GABAPENTIN (GA ba pen tin) treats nerve pain. It may also be used to prevent and control seizures in people with epilepsy. It works by calming overactive nerves in your body. This medicine may be used for other purposes; ask your health care provider or pharmacist if you have questions. COMMON BRAND NAME(S): Active-PAC  with Gabapentin, Orpha Bur, Gralise, Neurontin What should I tell my care team before I take this medication? They need to know if you have any of these conditions: Alcohol or substance use disorder Kidney disease Lung or breathing disease Suicidal thoughts, plans, or attempt; a previous suicide attempt by you or a family member An unusual or allergic reaction to gabapentin, other medications, foods, dyes, or preservatives Pregnant or trying to get pregnant Breast-feeding How should I use this medication? Take this medication by mouth with a glass of water. Follow the directions on the prescription label. You can take it with or without food. If it upsets your stomach, take it with food. Take your medication at regular intervals. Do not take it more often than directed. Do not stop taking except on your care team's advice. If you are directed to break the 600 or 800 mg tablets in half as part of your dose, the extra half tablet should be used for the next dose. If you have not used the extra half tablet within 28 days, it should be thrown away. A special MedGuide will be given to you by the pharmacist with each prescription and refill. Be sure to read this information carefully each time. Talk to your care team about the use of this medication in children. While this medication may be prescribed for children as young as 3 years for selected conditions, precautions do apply. Overdosage: If you think you have taken too much of this medicine contact a poison control center or emergency room at once. NOTE: This medicine is only for you. Do not share this medicine with others. What if I miss a dose? If you miss a dose, take it as soon as you can. If it is almost time for your next dose, take only that dose. Do not take double or extra doses. What may interact with this medication? Alcohol Antihistamines for allergy, cough, and cold Certain medications for anxiety or sleep Certain medications for  depression like amitriptyline, fluoxetine, sertraline Certain medications for seizures like phenobarbital, primidone Certain medications for stomach problems General anesthetics like halothane, isoflurane, methoxyflurane, propofol Local anesthetics like lidocaine, pramoxine, tetracaine Medications that relax muscles for surgery Opioid medications for pain Phenothiazines like chlorpromazine, mesoridazine, prochlorperazine, thioridazine This list may not describe all possible interactions. Give your health care provider a list of all the medicines, herbs, non-prescription drugs, or dietary supplements you use. Also tell them if you smoke, drink alcohol, or use illegal drugs. Some items may interact with your medicine. What should I watch for while using this medication? Visit your care team for regular checks on your progress. You may want to keep a record at home of how you feel your condition is responding to treatment. You may want to share this information with your care team at each visit. You should contact your care team if your seizures get worse or if you have any new  types of seizures. Do not stop taking this medication or any of your seizure medications unless instructed by your care team. Stopping your medication suddenly can increase your seizures or their severity. This medication may cause serious skin reactions. They can happen weeks to months after starting the medication. Contact your care team right away if you notice fevers or flu-like symptoms with a rash. The rash may be red or purple and then turn into blisters or peeling of the skin. Or, you might notice a red rash with swelling of the face, lips or lymph nodes in your neck or under your arms. Wear a medical identification bracelet or chain if you are taking this medication for seizures. Carry a card that lists all your medications. This medication may affect your coordination, reaction time, or judgment. Do not drive or operate  machinery until you know how this medication affects you. Sit up or stand slowly to reduce the risk of dizzy or fainting spells. Drinking alcohol with this medication can increase the risk of these side effects. Your mouth may get dry. Chewing sugarless gum or sucking hard candy, and drinking plenty of water may help. Watch for new or worsening thoughts of suicide or depression. This includes sudden changes in mood, behaviors, or thoughts. These changes can happen at any time but are more common in the beginning of treatment or after a change in dose. Call your care team right away if you experience these thoughts or worsening depression. If you become pregnant while using this medication, you may enroll in the [place] Pregnancy Registry by calling [phone]. This registry collects information about the safety of antiepileptic medication use during pregnancy. What side effects may I notice from receiving this medication? Side effects that you should report to your care team as soon as possible: Allergic reactions or angioedema--skin rash, itching, hives, swelling of the face, eyes, lips, tongue, arms, or legs, trouble swallowing or breathing Rash, fever, and swollen lymph nodes Thoughts of suicide or self harm, worsening mood, feelings of depression Trouble breathing Unusual changes in mood or behavior in children after use such as difficulty concentrating, hostility, or restlessness Side effects that usually do not require medical attention (report to your care team if they continue or are bothersome): Dizziness Drowsiness Nausea Swelling of ankles, feet, or hands Vomiting This list may not describe all possible side effects. Call your doctor for medical advice about side effects. You may report side effects to FDA at 1-800-FDA-1088. Where should I keep my medication? Keep out of reach of children and pets. Store at room temperature between 15 and 30 degrees C (59  and 86 degrees F). Get rid of any unused medication after the expiration date. This medication may cause accidental overdose and death if taken by other adults, children, or pets. To get rid of medications that are no longer needed or have expired: Take the medication to a medication take-back program. Check with your pharmacy or law enforcement to find a location. If you cannot return the medication, check the label or package insert to see if the medication should be thrown out in the garbage or flushed down the toilet. If you are not sure, ask your care team. If it is safe to put it in the trash, empty the medication out of the container. Mix the medication with cat litter, dirt, coffee grounds, or other unwanted substance. Seal the mixture in a bag or container. Put it in the trash. NOTE: This sheet is  a summary. It may not cover all possible information. If you have questions about this medicine, talk to your doctor, pharmacist, or health care provider.  2023 Elsevier/Gold Standard (2021-01-09 00:00:00)

## 2021-12-26 ENCOUNTER — Encounter: Payer: Self-pay | Admitting: Obstetrics and Gynecology

## 2021-12-26 LAB — FOLLICLE STIMULATING HORMONE: FSH: 6 m[IU]/mL

## 2021-12-26 LAB — ESTRADIOL: Estradiol: 34 pg/mL

## 2021-12-28 ENCOUNTER — Ambulatory Visit: Payer: Commercial Managed Care - PPO | Admitting: Obstetrics and Gynecology

## 2022-08-06 ENCOUNTER — Telehealth: Payer: Self-pay

## 2022-08-06 NOTE — Telephone Encounter (Signed)
Patient reports that her right breast has had pain and she is having a clear discharge from the nipple.   Recommended office visit for breast exam. Message sent to appt desk to schedule.

## 2022-08-08 ENCOUNTER — Telehealth: Payer: Self-pay | Admitting: Obstetrics and Gynecology

## 2022-08-08 ENCOUNTER — Ambulatory Visit: Payer: BC Managed Care – PPO | Admitting: Obstetrics and Gynecology

## 2022-08-08 ENCOUNTER — Encounter: Payer: Self-pay | Admitting: Obstetrics and Gynecology

## 2022-08-08 VITALS — BP 128/80 | HR 60 | Ht 65.0 in | Wt 257.0 lb

## 2022-08-08 DIAGNOSIS — N6452 Nipple discharge: Secondary | ICD-10-CM | POA: Diagnosis not present

## 2022-08-08 DIAGNOSIS — N644 Mastodynia: Secondary | ICD-10-CM

## 2022-08-08 NOTE — Progress Notes (Signed)
GYNECOLOGY  VISIT   HPI: 47 y.o.   Married  [place] American  female   G4P0 with Patient's last menstrual period was 03/15/2021 (approximate).   here for right breast pain with clear nipple discharge, starting 2 weeks ago.   Nipple feels warm to the touch.   Has leakage that is spontaneous and occurs with expression from the nipple.  No HA or loss of peripheral vision. No hx thyroid disease.  FH breast cancer in her mother.   Had Covid, flu B, and strep all since November.  Took various abx and diflucan.   GYNECOLOGIC HISTORY: Patient's last menstrual period was 03/15/2021 (approximate). Contraception:  hysterectomy, Breast Density Category B, BI-RADS CATEGORY 1 Neg Menopausal hormone therapy:  n/a Last mammogram:  12/25/21 Last pap smear:   08/19/20 neg: HR HPV neg        OB History     Gravida  4   Para      Term      Preterm      AB      Living  4      SAB      IAB      Ectopic      Multiple      Live Births  4              Patient Active Problem List   Diagnosis Date Noted   Status post laparoscopic hysterectomy 04/03/2021    Past Medical History:  Diagnosis Date   Cervical herniated disc    left C 6 - C7 reports pinched nerve also on right side   COVID 07/2019   headache n/v diarrhea, loss of taste and smell all symptoms resolved   Elevated hemoglobin A1c 03/2021   6.1   Superficial thrombosis of leg, right 2007   from birth control pills   Vitamin D deficiency 03/29/2021   Wears contact lenses 03/29/2021   Wears glasses 03/29/2021    Past Surgical History:  Procedure Laterality Date   BLADDER SUSPENSION N/A 04/03/2021   Procedure: TRANSVAGINAL TAPE (TVT) PROCEDURE;  Surgeon: Nunzio Cobbs, MD;  Location: [facility];  Service: Gynecology;  Laterality: N/A;   CYSTOSCOPY N/A 04/03/2021   Procedure: CYSTOSCOPY;  Surgeon: Nunzio Cobbs, MD;  Location: [facility];  Service: Gynecology;   Laterality: N/A;   TOTAL LAPAROSCOPIC HYSTERECTOMY WITH SALPINGECTOMY Bilateral 04/03/2021   Procedure: TOTAL LAPAROSCOPIC HYSTERECTOMY WITH SALPINGECTOMY;  Surgeon: Nunzio Cobbs, MD;  Location: [facility];  Service: Gynecology;  Laterality: Bilateral;   TUBAL LIGATION     18 to 19 yrs ago per pt on 03-29-2021    Current Outpatient Medications  Medication Sig Dispense Refill   Vitamin D, Ergocalciferol, (DRISDOL) 1.25 MG (50000 UNIT) CAPS capsule Take 50,000 Units by mouth once a week.     No current facility-administered medications for this visit.     ALLERGIES: Cinnamon, Clindamycin/lincomycin, Hydrocodone, Penicillins, and Percocet [oxycodone-acetaminophen]  Family History  Problem Relation Age of Onset   Breast cancer Mother 18       diagnosed again at age 73   Diabetes Mother    Hypertension Mother    Hyperlipidemia Mother    Hypertension Father    Hyperlipidemia Father    Diabetes Father    Heart disease Father    Rheum arthritis Father    Hypertension Sister    Breast cancer Paternal Aunt     Social History  Socioeconomic History   Marital status: Married    Spouse name: Not on file   Number of children: Not on file   Years of education: Not on file   Highest education level: Not on file  Occupational History   Not on file  Tobacco Use   Smoking status: Never   Smokeless tobacco: Never  Vaping Use   Vaping Use: Never used  Substance and Sexual Activity   Alcohol use: Not Currently   Drug use: Never   Sexual activity: Yes    Birth control/protection: Surgical    Comment: BTL, hysterectomy (not since surgery)  Other Topics Concern   Not on file  Social History Narrative   Not on file   Social Determinants of Health   Financial Resource Strain: Not on file  Food Insecurity: Not on file  Transportation Needs: Not on file  Physical Activity: Not on file  Stress: Not on file  Social Connections: Not on file  Intimate  Partner Violence: Not on file    Review of Systems  Musculoskeletal:        Breast pain, discharge    PHYSICAL EXAMINATION:    BP 128/80 (BP Location: Right Arm, Patient Position: Sitting, Cuff Size: Large)   Pulse 60   Ht '5\' 5"'$  (1.651 m)   Wt 257 lb (116.6 kg)   LMP 03/15/2021 (Approximate)   BMI 42.77 kg/m     General appearance: alert, cooperative and appears stated age  Breasts: left - normal appearance, no masses or tenderness, No nipple retraction or dimpling, No nipple discharge or bleeding, No axillary or supraclavicular adenopathy Right -  normal appearance, no masses or tenderness, No nipple retraction or dimpling, clear nipple discharge present which appears to be coming from one duct, no bleeding, No axillary or supraclavicular adenopathy  Chaperone was present for exam:  patient declined.  ASSESSMENT  Right nipple discahrge.  Right breast pain.   FH breast cancer in mother.  PLAN  Prolactin, TSH, and BMP with GFR.  Dx right mammogram and right breast ultrasound at the [facility].  Patient is not available 1/23, 1/24, and 1/25.  Reduce caffeine.  Try ibuprofen and heat or ice pack for pain.  Fu prn.    An After Visit Summary was printed and given to the patient.  22 min  total time was spent for this patient encounter, including preparation, face-to-face counseling with the patient, coordination of care, and documentation of the encounter.

## 2022-08-08 NOTE — Patient Instructions (Signed)
Galactorrhea Galactorrhea is an abnormal milky discharge from the breast. The discharge may come from one or both nipples. It is different from the normal milk produced in nursing mothers. Galactorrhea usually occurs in women, but it can sometimes affect men. Galactorrhea can be a sign of something more serious, such as diseases of the kidney or thyroid, or problems with the pituitary gland. Your health care provider may do various tests to help determine the cause. What are the causes? This condition may be caused by: Irritation of the breast, which can result from injury, stimulation during sexual activity, or clothes rubbing against the nipple. Certain prescription medicines. Birth control pills. Certain herbal supplements. Changes in hormone levels. Stress. Sometimes the cause is not known. What are the signs or symptoms? The main symptom of this condition is nipple discharge. The discharge is often white, yellow, or green, and can be seen in one or both breasts. The discharge may flow without stopping, or it may stop and start again. How is this diagnosed? This condition may be diagnosed based on: Collecting and testing the discharge. Blood tests. Imaging tests. Tests to rule out other conditions. How is this treated? In many cases, galactorrhea will go away without treatment. In this case, your health care provider will monitor your condition to make sure that it goes away. When treatment is required, your health care provider will treat the underlying cause, such as irritation of the nipples or changes in hormone levels. Certain medicines may be stopped, if they are causing your symptoms. Follow these instructions at home:  Breast care Watch your condition for any changes. Do not squeeze your breasts or nipples. Avoid breast stimulation during sexual activity. Perform a breast self-exam once a month. Doing this more often can irritate your breasts. Avoid clothes that rub on your  nipples. Use breast pads to absorb the discharge. Wear a breast binder or a support bra to help prevent clothes from rubbing on your nipples. General instructions Take over-the-counter and prescription medicines only as told by your health care provider. Keep all follow-up visits. This is important. Contact a health care provider if: You develop hot flashes, vaginal dryness, or a lack of sexual desire. You stop having menstrual periods, or they become irregular or far apart. You have headaches. You have vision problems. Get help right away if: You have breast discharge that is bloody or pus-like. You have breast pain. You feel a lump in your breast. You have wrinkling or dimpling on your breast. You notice that your breast becomes red and swollen. Summary Galactorrhea is an abnormal milky discharge from the breast. The fluid may come from one or both nipples and is often white, yellow, or green. Galactorrhea may be caused by various things, such as irritation of the nipples, medicines, or changes in hormone levels. Galactorrhea often goes away without treatment. However, it also may be a sign of something more serious, such as diseases of the kidney or thyroid, or problems with the pituitary gland. Get help right away if you have discharge that is bloody or pus-like, if you have breast pain or a lump, or if you have skin changes on your breast. This information is not intended to replace advice given to you by your health care provider. Make sure you discuss any questions you have with your health care provider. Document Revised: 12/12/2021 Document Reviewed: 05/09/2020 Elsevier Patient Education  [place].

## 2022-08-08 NOTE — Telephone Encounter (Signed)
Please schedule diagnostic right mammogram and right breast US at the [facility].   Diagnosis is right nipple discharge and right breast pain.   She is unavailable on 1/23, 1/24, and 1/25.

## 2022-08-09 LAB — BASIC METABOLIC PANEL WITH GFR
BUN: 8 mg/dL (ref 7–25)
CO2: 22 mmol/L (ref 20–32)
Calcium: 9 mg/dL (ref 8.6–10.2)
Chloride: 105 mmol/L (ref 98–110)
Creat: 0.53 mg/dL (ref 0.50–0.99)
Glucose, Bld: 103 mg/dL — ABNORMAL HIGH (ref 65–99)
Potassium: 3.6 mmol/L (ref 3.5–5.3)
Sodium: 137 mmol/L (ref 135–146)
eGFR: 116 mL/min/{1.73_m2} (ref >=60)

## 2022-08-09 LAB — TSH: TSH: 2.12 mIU/L

## 2022-08-09 LAB — PROLACTIN: Prolactin: 8.8 ng/mL

## 2022-08-10 NOTE — Telephone Encounter (Signed)
FYI. Called [facility] before I left for the day to check for cancellations. I was able to get the pt an earlier appt for 08/22/2022 @ 8am. Pt notified and voiced understanding.

## 2022-08-10 NOTE — Telephone Encounter (Signed)
I agree with referral to [facility] for diagnostic breast imaging.

## 2022-08-10 NOTE — Telephone Encounter (Signed)
[facility].  If the patient is agreeable to the new date they are offering, I am fine with that.

## 2022-08-10 NOTE — Telephone Encounter (Signed)
We must have sent our messages at the same time. Please review mine.

## 2022-08-10 NOTE — Telephone Encounter (Signed)
Kathryn Duncan w/ [facility] has pt scheduled for 09/28/2022 @ 10am.   Pt notified and voiced understanding. Pt aware she can call [facility] on a daily basis to check for cancellations since they dont have a wait list.  However, I also advised pt of the [facility] location typically having earlier availability w/ my recent experience. Pt also advised some times it can feel like a hassle because they may require her to have her films transferred to their location. Pt voiced understanding but is just worried due to symptoms and would like to be seen ASAP and would like to inquire about an appt w/ other location.   OK to send order to [facility]? Please advise.

## 2022-08-22 ENCOUNTER — Ambulatory Visit
Admission: RE | Admit: 2022-08-22 | Discharge: 2022-08-22 | Disposition: A | Discharge status: Home / Self Care | Payer: Self-pay | Source: Ambulatory Visit | Attending: Obstetrics and Gynecology | Admitting: Obstetrics and Gynecology

## 2022-08-22 ENCOUNTER — Ambulatory Visit
Admission: RE | Admit: 2022-08-22 | Discharge: 2022-08-22 | Disposition: A | Discharge status: Home / Self Care | Payer: BC Managed Care – PPO | Source: Ambulatory Visit | Attending: Obstetrics and Gynecology | Admitting: Obstetrics and Gynecology

## 2022-08-22 DIAGNOSIS — N644 Mastodynia: Secondary | ICD-10-CM

## 2022-08-22 DIAGNOSIS — N6452 Nipple discharge: Secondary | ICD-10-CM

## 2022-08-23 ENCOUNTER — Other Ambulatory Visit: Payer: Self-pay | Admitting: Obstetrics and Gynecology

## 2022-08-23 DIAGNOSIS — N6310 Unspecified lump in the right breast, unspecified quadrant: Secondary | ICD-10-CM

## 2022-08-28 ENCOUNTER — Ambulatory Visit
Admission: RE | Admit: 2022-08-28 | Discharge: 2022-08-28 | Disposition: A | Discharge status: Home / Self Care | Payer: BC Managed Care – PPO | Source: Ambulatory Visit | Attending: Obstetrics and Gynecology | Admitting: Obstetrics and Gynecology

## 2022-08-28 DIAGNOSIS — N6310 Unspecified lump in the right breast, unspecified quadrant: Secondary | ICD-10-CM

## 2022-08-28 HISTORY — PX: BREAST BIOPSY: SHX20

## 2022-08-28 NOTE — Telephone Encounter (Signed)
Patient had appt 08/08/22.

## 2022-09-08 IMAGING — MR MR ANKLE*L* WO/W CM
8 series · 40 of 40 positions shown · IV contrast (multihance)
Comparison: Left foot and ankle x-rays dated January 21, 2020.

CLINICAL DATA: Posterior ankle pain and swelling for the past 6
months. No injury or prior surgery.

EXAM:
MRI OF THE LEFT ANKLE WITHOUT AND WITH CONTRAST
TECHNIQUE: Multiplanar, multisequence MR imaging of the ankle was performed
before and after the administration of intravenous contrast.
CONTRAST:  20mL MULTIHANCE GADOBENATE DIMEGLUMINE 529 MG/ML IV SOLN

[Series 4: T2 fat-sat · axial · 3.0mm · 0.53mm/px · z∈[-64,+75]mm · 6 of 36 slices shown (1 of 2)]
[im 1/36]
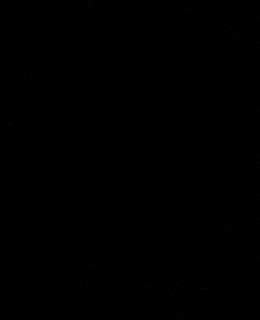
[im 8/36]
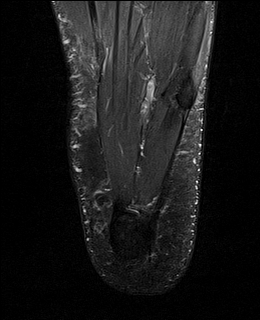
[im 15/36]
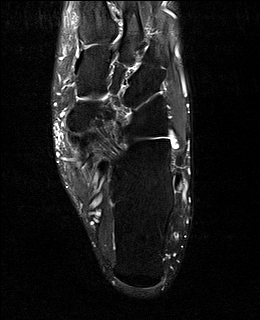
[im 22/36]
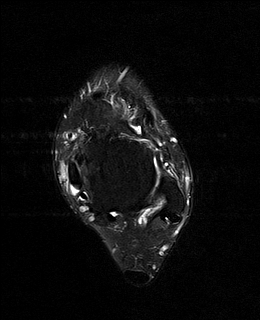
[im 29/36]
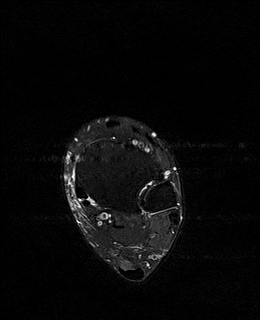
[im 36/36]
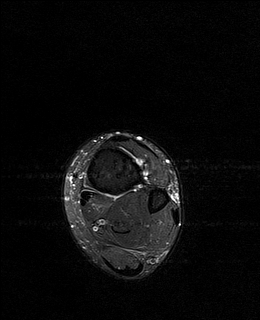

[Series 5: PD fat-sat · axial · 3.0mm · 0.47mm/px · z∈[-60,+70]mm · 6 of 36 slices shown]
[im 1/36]
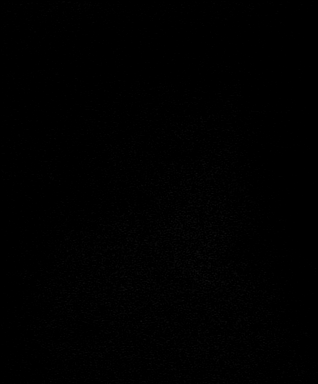
[im 8/36]
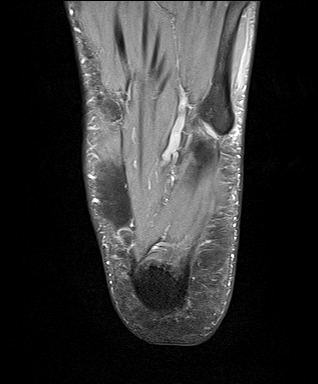
[im 15/36]
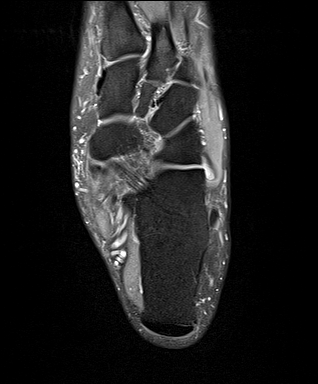
[im 22/36]
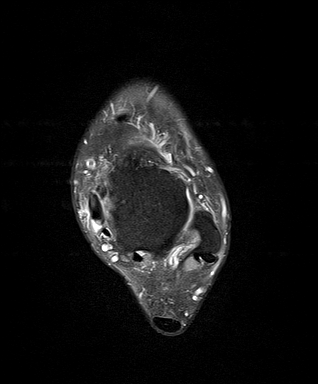
[im 29/36]
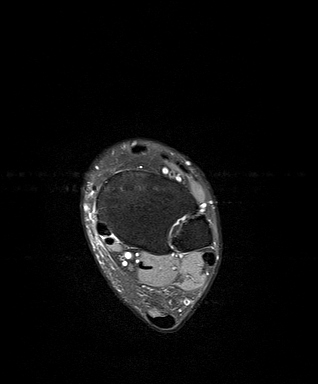
[im 36/36]
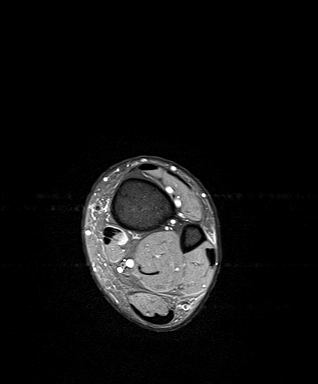

[Series 6: T1 · sagittal · 4.0mm · 0.56mm/px · 4 of 22 slices shown]
[im 1/22]
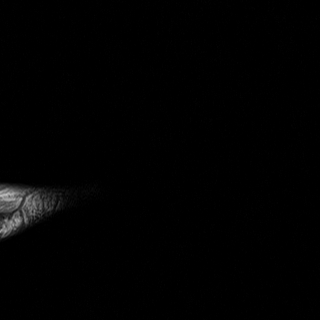
[im 8/22]
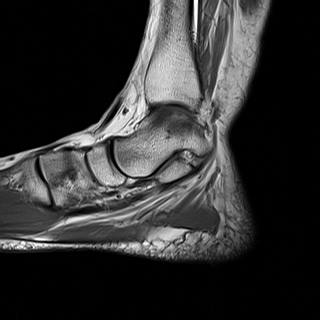
[im 15/22]
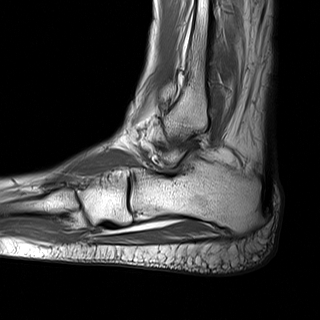
[im 22/22]
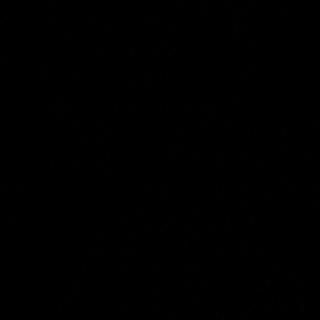

[Series 7: STIR · sagittal · 4.0mm · 0.70mm/px · 3 of 21 slices shown]
[im 1/21]
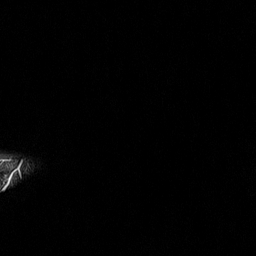
[im 11/21]
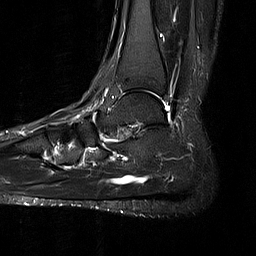
[im 21/21]
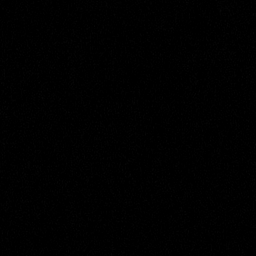

[Series 8: T2 fat-sat · coronal · 3.0mm · 0.62mm/px · 6 of 42 slices shown (2 of 2)]
[im 1/42]
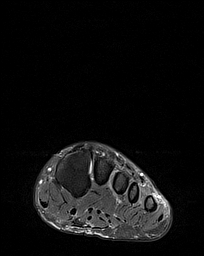
[im 9/42]
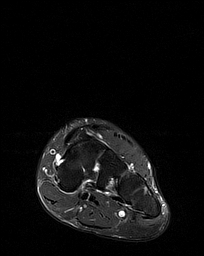
[im 17/42]
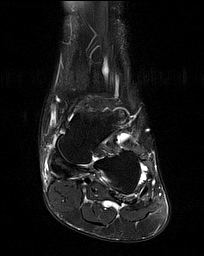
[im 25/42]
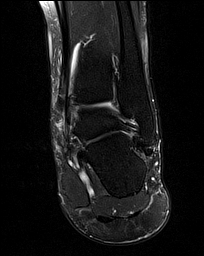
[im 33/42]
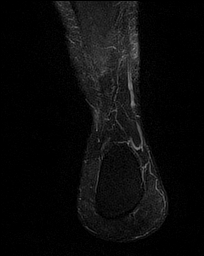
[im 42/42]
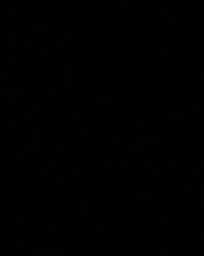

[Series 9: T1 fat-sat · axial · 3.0mm · 0.62mm/px · z∈[-64,+76]mm · 5 of 36 slices shown (1 of 3)]
[im 1/36]
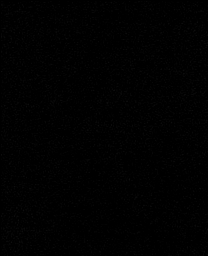
[im 9/36]
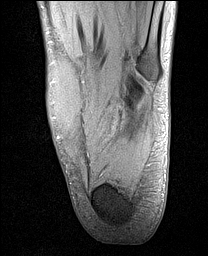
[im 18/36]
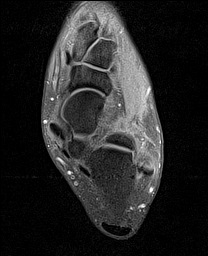
[im 27/36]
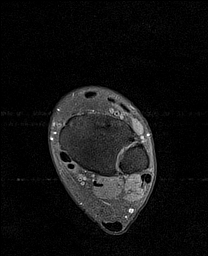
[im 36/36]
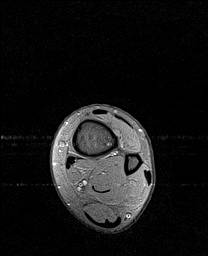

[Series 10: T1 fat-sat · axial · 3.0mm · 0.62mm/px · z∈[-64,+76]mm · 5 of 36 slices shown (2 of 3)]
[im 1/36]
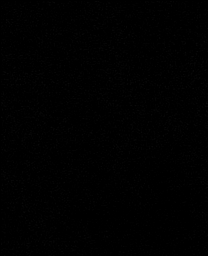
[im 9/36]
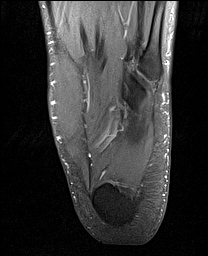
[im 18/36]
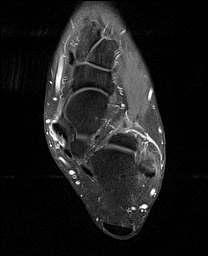
[im 27/36]
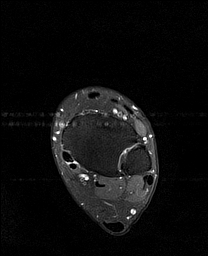
[im 36/36]
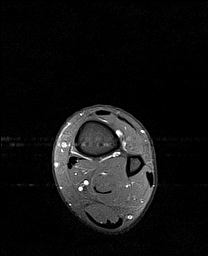

[Series 11: T1 fat-sat · coronal · 3.5mm · 0.62mm/px · 5 of 36 slices shown (3 of 3)]
[im 1/36]
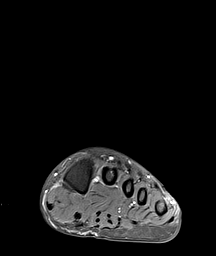
[im 9/36]
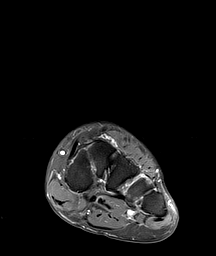
[im 18/36]
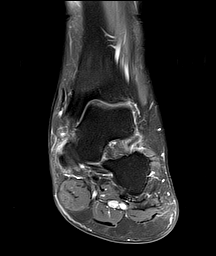
[im 27/36]
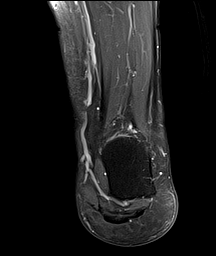
[im 36/36]
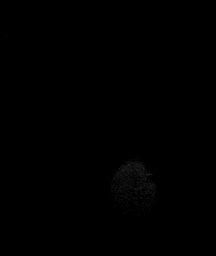

[40 of 40 positions shown; findings below may reference images not displayed]

FINDINGS: TENDONS

Peroneal: Peroneal longus tendon intact. Peroneal brevis intact.

Posteromedial: Posterior tibial tendon intact with mild thickening
and intermediate signal. Small amount of fluid in the tendon sheath
with synovial enhancement. Flexor hallucis longus tendon intact.
Flexor digitorum longus tendon intact.

Anterior: Tibialis anterior tendon intact. Extensor hallucis longus
tendon intact Extensor digitorum longus tendon intact.

Achilles:  Intact.

Plantar Fascia: Intact. Thickening of the proximal central band
without surrounding inflammatory changes.

LIGAMENTS

Lateral: Anterior talofibular ligament intact. Calcaneofibular
ligament intact. Posterior talofibular ligament intact. Anterior and
posterior tibiofibular ligaments intact.

Medial: Deltoid ligament intact. Spring ligament intact.

CARTILAGE

Ankle Joint: No joint effusion. Small focus of marrow edema in the
medial talar dome with overlying cartilage defect.

Subtalar Joints/Sinus Tarsi: Normal subtalar joints. No subtalar
joint effusion. 10 x 6 x 15 mm ganglion cyst in the sinus tarsi.

Bones: No suspicious marrow signal abnormality. No fracture or
dislocation.

Soft Tissue: No soft tissue mass or fluid collection.
IMPRESSION: IMPRESSION
1. Mild posterior tibial tendinosis and tenosynovitis.  No tear.
2. Small osteochondral lesion of the medial talar dome without
instability.
3. 15 mm ganglion cyst in the sinus tarsi.

## 2022-09-14 ENCOUNTER — Ambulatory Visit: Payer: Self-pay | Admitting: General Surgery

## 2022-09-14 DIAGNOSIS — D241 Benign neoplasm of right breast: Secondary | ICD-10-CM

## 2022-09-14 MED ORDER — KETOROLAC TROMETHAMINE 15 MG/ML IJ SOLN: 15.0000 mg | Freq: Once | INTRAMUSCULAR | Status: AC

## 2022-09-17 ENCOUNTER — Other Ambulatory Visit: Payer: Self-pay | Admitting: General Surgery

## 2022-09-17 DIAGNOSIS — D241 Benign neoplasm of right breast: Secondary | ICD-10-CM

## 2022-09-21 HISTORY — PX: BREAST EXCISIONAL BIOPSY: SUR124

## 2022-09-28 ENCOUNTER — Other Ambulatory Visit: Payer: Self-pay

## 2022-10-05 ENCOUNTER — Encounter (HOSPITAL_BASED_OUTPATIENT_CLINIC_OR_DEPARTMENT_OTHER): Payer: Self-pay | Admitting: General Surgery

## 2022-10-05 ENCOUNTER — Other Ambulatory Visit: Payer: Self-pay

## 2022-10-11 ENCOUNTER — Ambulatory Visit
Admission: RE | Admit: 2022-10-11 | Discharge: 2022-10-11 | Disposition: A | Discharge status: Home / Self Care | Payer: BLUE CROSS/BLUE SHIELD | Source: Ambulatory Visit | Attending: General Surgery | Admitting: General Surgery

## 2022-10-11 ENCOUNTER — Encounter (HOSPITAL_BASED_OUTPATIENT_CLINIC_OR_DEPARTMENT_OTHER)
Admission: RE | Admit: 2022-10-11 | Discharge: 2022-10-11 | Disposition: A | Discharge status: Home / Self Care | Payer: BLUE CROSS/BLUE SHIELD | Source: Ambulatory Visit | Attending: General Surgery | Admitting: General Surgery

## 2022-10-11 DIAGNOSIS — I10 Essential (primary) hypertension: Secondary | ICD-10-CM | POA: Diagnosis not present

## 2022-10-11 DIAGNOSIS — N644 Mastodynia: Secondary | ICD-10-CM | POA: Diagnosis not present

## 2022-10-11 DIAGNOSIS — N6341 Unspecified lump in right breast, subareolar: Secondary | ICD-10-CM | POA: Diagnosis present

## 2022-10-11 DIAGNOSIS — D241 Benign neoplasm of right breast: Secondary | ICD-10-CM

## 2022-10-11 DIAGNOSIS — N6011 Diffuse cystic mastopathy of right breast: Secondary | ICD-10-CM | POA: Diagnosis not present

## 2022-10-11 DIAGNOSIS — Z803 Family history of malignant neoplasm of breast: Secondary | ICD-10-CM | POA: Diagnosis not present

## 2022-10-11 DIAGNOSIS — Z6841 Body Mass Index (BMI) 40.0 and over, adult: Secondary | ICD-10-CM | POA: Diagnosis not present

## 2022-10-11 DIAGNOSIS — N6452 Nipple discharge: Secondary | ICD-10-CM | POA: Diagnosis not present

## 2022-10-11 DIAGNOSIS — Z90710 Acquired absence of both cervix and uterus: Secondary | ICD-10-CM | POA: Diagnosis not present

## 2022-10-11 DIAGNOSIS — Z01810 Encounter for preprocedural cardiovascular examination: Secondary | ICD-10-CM | POA: Insufficient documentation

## 2022-10-11 HISTORY — PX: BREAST BIOPSY: SHX20

## 2022-10-11 NOTE — Progress Notes (Signed)

## 2022-10-12 ENCOUNTER — Ambulatory Visit (HOSPITAL_BASED_OUTPATIENT_CLINIC_OR_DEPARTMENT_OTHER): Payer: BLUE CROSS/BLUE SHIELD | Admitting: Anesthesiology

## 2022-10-12 ENCOUNTER — Ambulatory Visit (HOSPITAL_BASED_OUTPATIENT_CLINIC_OR_DEPARTMENT_OTHER)
Admission: RE | Admit: 2022-10-12 | Discharge: 2022-10-12 | Disposition: A | Discharge status: Home / Self Care | Payer: BLUE CROSS/BLUE SHIELD | Attending: General Surgery | Admitting: General Surgery

## 2022-10-12 ENCOUNTER — Ambulatory Visit
Admission: RE | Admit: 2022-10-12 | Discharge: 2022-10-12 | Disposition: A | Discharge status: Home / Self Care | Payer: BC Managed Care – PPO | Source: Ambulatory Visit | Attending: General Surgery | Admitting: General Surgery

## 2022-10-12 ENCOUNTER — Encounter (HOSPITAL_BASED_OUTPATIENT_CLINIC_OR_DEPARTMENT_OTHER)
Admission: RE | Disposition: A | Discharge status: Home / Self Care | Payer: Self-pay | Source: Home / Self Care | Attending: General Surgery

## 2022-10-12 ENCOUNTER — Other Ambulatory Visit: Payer: Self-pay

## 2022-10-12 ENCOUNTER — Encounter (HOSPITAL_BASED_OUTPATIENT_CLINIC_OR_DEPARTMENT_OTHER): Payer: Self-pay | Admitting: General Surgery

## 2022-10-12 DIAGNOSIS — D241 Benign neoplasm of right breast: Secondary | ICD-10-CM

## 2022-10-12 DIAGNOSIS — Z6841 Body Mass Index (BMI) 40.0 and over, adult: Secondary | ICD-10-CM | POA: Insufficient documentation

## 2022-10-12 DIAGNOSIS — Z90710 Acquired absence of both cervix and uterus: Secondary | ICD-10-CM | POA: Insufficient documentation

## 2022-10-12 DIAGNOSIS — N6341 Unspecified lump in right breast, subareolar: Secondary | ICD-10-CM | POA: Diagnosis not present

## 2022-10-12 DIAGNOSIS — N6452 Nipple discharge: Secondary | ICD-10-CM | POA: Insufficient documentation

## 2022-10-12 DIAGNOSIS — Z803 Family history of malignant neoplasm of breast: Secondary | ICD-10-CM | POA: Insufficient documentation

## 2022-10-12 DIAGNOSIS — I10 Essential (primary) hypertension: Secondary | ICD-10-CM | POA: Insufficient documentation

## 2022-10-12 DIAGNOSIS — N6011 Diffuse cystic mastopathy of right breast: Secondary | ICD-10-CM | POA: Insufficient documentation

## 2022-10-12 DIAGNOSIS — N644 Mastodynia: Secondary | ICD-10-CM | POA: Insufficient documentation

## 2022-10-12 HISTORY — PX: BREAST LUMPECTOMY WITH RADIOACTIVE SEED LOCALIZATION: SHX6424

## 2022-10-12 HISTORY — DX: Essential (primary) hypertension: I10

## 2022-10-12 SURGERY — BREAST LUMPECTOMY WITH RADIOACTIVE SEED LOCALIZATION
Anesthesia: General | Site: Breast | Laterality: Right

## 2022-10-12 MED ORDER — MIDAZOLAM HCL 5 MG/5ML IJ SOLN
INTRAMUSCULAR | Status: DC | PRN
  Administered 2022-10-12: 2 mg via INTRAVENOUS

## 2022-10-12 MED ORDER — PROMETHAZINE HCL 25 MG/ML IJ SOLN: 6.2500 mg | INTRAMUSCULAR | Status: DC | PRN

## 2022-10-12 MED ORDER — MIDAZOLAM HCL 2 MG/2ML IJ SOLN
INTRAMUSCULAR | Status: AC
  Filled 2022-10-12: qty 2

## 2022-10-12 MED ORDER — CHLORHEXIDINE GLUCONATE CLOTH 2 % EX PADS: 6.0000 | MEDICATED_PAD | Freq: Once | CUTANEOUS | Status: DC

## 2022-10-12 MED ORDER — KETOROLAC TROMETHAMINE 30 MG/ML IJ SOLN
30.0000 mg | Freq: Once | INTRAMUSCULAR | Status: AC | PRN
  Administered 2022-10-12: 30 mg via INTRAVENOUS

## 2022-10-12 MED ORDER — KETOROLAC TROMETHAMINE 30 MG/ML IJ SOLN
INTRAMUSCULAR | Status: AC
  Filled 2022-10-12: qty 1

## 2022-10-12 MED ORDER — PROPOFOL 10 MG/ML IV BOLUS
INTRAVENOUS | Status: DC | PRN
  Administered 2022-10-12: 200 mg via INTRAVENOUS

## 2022-10-12 MED ORDER — ONDANSETRON HCL 4 MG/2ML IJ SOLN
INTRAMUSCULAR | Status: AC
  Filled 2022-10-12: qty 2

## 2022-10-12 MED ORDER — GABAPENTIN 300 MG PO CAPS
ORAL_CAPSULE | ORAL | Status: AC
  Filled 2022-10-12: qty 1

## 2022-10-12 MED ORDER — DIPHENHYDRAMINE HCL 50 MG/ML IJ SOLN
INTRAMUSCULAR | Status: AC
  Filled 2022-10-12: qty 1

## 2022-10-12 MED ORDER — DIPHENHYDRAMINE HCL 50 MG/ML IJ SOLN
6.2500 mg | Freq: Once | INTRAMUSCULAR | Status: AC
  Administered 2022-10-12: 6.25 mg via INTRAVENOUS

## 2022-10-12 MED ORDER — OXYCODONE HCL 5 MG/5ML PO SOLN: 5.0000 mg | Freq: Once | ORAL | Status: AC | PRN

## 2022-10-12 MED ORDER — ACETAMINOPHEN 10 MG/ML IV SOLN: 1000.0000 mg | Freq: Once | INTRAVENOUS | Status: DC | PRN

## 2022-10-12 MED ORDER — AMISULPRIDE (ANTIEMETIC) 5 MG/2ML IV SOLN: 10.0000 mg | Freq: Once | INTRAVENOUS | Status: DC | PRN

## 2022-10-12 MED ORDER — FENTANYL CITRATE (PF) 100 MCG/2ML IJ SOLN
INTRAMUSCULAR | Status: AC
  Filled 2022-10-12: qty 2

## 2022-10-12 MED ORDER — OXYCODONE HCL 5 MG PO TABS
ORAL_TABLET | ORAL | Status: AC
  Filled 2022-10-12: qty 1

## 2022-10-12 MED ORDER — PHENYLEPHRINE 80 MCG/ML (10ML) SYRINGE FOR IV PUSH (FOR BLOOD PRESSURE SUPPORT)
PREFILLED_SYRINGE | INTRAVENOUS | Status: AC
  Filled 2022-10-12: qty 10

## 2022-10-12 MED ORDER — LIDOCAINE 2% (20 MG/ML) 5 ML SYRINGE
INTRAMUSCULAR | Status: AC
  Filled 2022-10-12: qty 5

## 2022-10-12 MED ORDER — ONDANSETRON HCL 4 MG/2ML IJ SOLN
INTRAMUSCULAR | Status: DC | PRN
  Administered 2022-10-12: 4 mg via INTRAVENOUS

## 2022-10-12 MED ORDER — OXYCODONE HCL 5 MG PO TABS
5.0000 mg | ORAL_TABLET | Freq: Once | ORAL | Status: AC | PRN
  Administered 2022-10-12: 5 mg via ORAL

## 2022-10-12 MED ORDER — LIDOCAINE HCL (CARDIAC) PF 100 MG/5ML IV SOSY
PREFILLED_SYRINGE | INTRAVENOUS | Status: DC | PRN
  Administered 2022-10-12: 60 mg via INTRAVENOUS

## 2022-10-12 MED ORDER — VANCOMYCIN HCL IN DEXTROSE 1-5 GM/200ML-% IV SOLN
INTRAVENOUS | Status: AC
  Filled 2022-10-12: qty 200

## 2022-10-12 MED ORDER — DEXAMETHASONE SODIUM PHOSPHATE 10 MG/ML IJ SOLN
INTRAMUSCULAR | Status: AC
  Filled 2022-10-12: qty 1

## 2022-10-12 MED ORDER — PHENYLEPHRINE 80 MCG/ML (10ML) SYRINGE FOR IV PUSH (FOR BLOOD PRESSURE SUPPORT)
PREFILLED_SYRINGE | INTRAVENOUS | Status: DC | PRN
  Administered 2022-10-12: 80 ug via INTRAVENOUS

## 2022-10-12 MED ORDER — LACTATED RINGERS IV SOLN: INTRAVENOUS | Status: DC

## 2022-10-12 MED ORDER — FENTANYL CITRATE (PF) 100 MCG/2ML IJ SOLN
25.0000 ug | INTRAMUSCULAR | Status: DC | PRN
  Administered 2022-10-12: 50 ug via INTRAVENOUS

## 2022-10-12 MED ORDER — BUPIVACAINE HCL (PF) 0.25 % IJ SOLN
INTRAMUSCULAR | Status: DC | PRN
  Administered 2022-10-12: 20 mL

## 2022-10-12 MED ORDER — DIPHENHYDRAMINE HCL 50 MG/ML IJ SOLN: 6.2500 mg | Freq: Once | INTRAMUSCULAR | Status: DC

## 2022-10-12 MED ORDER — TRAMADOL HCL 50 MG PO TABS: 50.0000 mg | ORAL_TABLET | Freq: Four times a day (QID) | ORAL | 0 refills | Status: DC | PRN

## 2022-10-12 MED ORDER — FENTANYL CITRATE (PF) 100 MCG/2ML IJ SOLN
INTRAMUSCULAR | Status: DC | PRN
  Administered 2022-10-12: 50 ug via INTRAVENOUS

## 2022-10-12 MED ORDER — DEXAMETHASONE SODIUM PHOSPHATE 4 MG/ML IJ SOLN
INTRAMUSCULAR | Status: DC | PRN
  Administered 2022-10-12: 8 mg via INTRAVENOUS

## 2022-10-12 MED ORDER — VANCOMYCIN HCL IN DEXTROSE 1-5 GM/200ML-% IV SOLN
1000.0000 mg | INTRAVENOUS | Status: AC
  Administered 2022-10-12: 1000 mg via INTRAVENOUS

## 2022-10-12 MED ORDER — GABAPENTIN 300 MG PO CAPS
300.0000 mg | ORAL_CAPSULE | ORAL | Status: AC
  Administered 2022-10-12: 300 mg via ORAL

## 2022-10-12 SURGICAL SUPPLY — 40 items
ADH SKN CLS APL DERMABOND .7 (GAUZE/BANDAGES/DRESSINGS) ×1
APL PRP STRL LF DISP 70% ISPRP (MISCELLANEOUS) ×1
APPLIER CLIP 9.375 MED OPEN (MISCELLANEOUS)
APR CLP MED 9.3 20 MLT OPN (MISCELLANEOUS)
BLADE SURG 15 STRL LF DISP TIS (BLADE) ×1 IMPLANT
BLADE SURG 15 STRL SS (BLADE) ×1
CANISTER SUC SOCK COL 7IN (MISCELLANEOUS) ×1 IMPLANT
CANISTER SUCT 1200ML W/VALVE (MISCELLANEOUS) ×1 IMPLANT
CHLORAPREP W/TINT 26 (MISCELLANEOUS) ×1 IMPLANT
CLIP APPLIE 9.375 MED OPEN (MISCELLANEOUS) IMPLANT
COVER BACK TABLE 60X90IN (DRAPES) ×1 IMPLANT
COVER MAYO STAND STRL (DRAPES) ×1 IMPLANT
COVER PROBE CYLINDRICAL 5X96 (MISCELLANEOUS) ×1 IMPLANT
DERMABOND ADVANCED .7 DNX12 (GAUZE/BANDAGES/DRESSINGS) ×1 IMPLANT
DRAPE LAPAROSCOPIC ABDOMINAL (DRAPES) ×1 IMPLANT
DRAPE UTILITY XL STRL (DRAPES) ×1 IMPLANT
ELECT COATED BLADE 2.86 ST (ELECTRODE) ×1 IMPLANT
ELECT REM PT RETURN 9FT ADLT (ELECTROSURGICAL) ×1
ELECTRODE REM PT RTRN 9FT ADLT (ELECTROSURGICAL) ×1 IMPLANT
GLOVE BIO SURGEON STRL SZ 6.5 (GLOVE) IMPLANT
GLOVE BIO SURGEON STRL SZ7.5 (GLOVE) ×2 IMPLANT
GOWN STRL REUS W/ TWL LRG LVL3 (GOWN DISPOSABLE) ×2 IMPLANT
GOWN STRL REUS W/TWL LRG LVL3 (GOWN DISPOSABLE) ×2
KIT MARKER MARGIN INK (KITS) ×1 IMPLANT
NDL HYPO 25X1 1.5 SAFETY (NEEDLE) IMPLANT
NEEDLE HYPO 25X1 1.5 SAFETY (NEEDLE) ×1 IMPLANT
NS IRRIG 1000ML POUR BTL (IV SOLUTION) IMPLANT
PACK BASIN DAY SURGERY FS (CUSTOM PROCEDURE TRAY) ×1 IMPLANT
PENCIL SMOKE EVACUATOR (MISCELLANEOUS) ×1 IMPLANT
SLEEVE SCD COMPRESS KNEE MED (STOCKING) ×1 IMPLANT
SPIKE FLUID TRANSFER (MISCELLANEOUS) IMPLANT
SPONGE T-LAP 18X18 ~~LOC~~+RFID (SPONGE) ×1 IMPLANT
SUT MON AB 4-0 PC3 18 (SUTURE) ×1 IMPLANT
SUT SILK 2 0 SH (SUTURE) IMPLANT
SUT VICRYL 3-0 CR8 SH (SUTURE) ×1 IMPLANT
SYR CONTROL 10ML LL (SYRINGE) IMPLANT
TOWEL GREEN STERILE FF (TOWEL DISPOSABLE) ×1 IMPLANT
TRAY FAXITRON CT DISP (TRAY / TRAY PROCEDURE) ×1 IMPLANT
TUBE CONNECTING 20X1/4 (TUBING) ×1 IMPLANT
YANKAUER SUCT BULB TIP NO VENT (SUCTIONS) IMPLANT

## 2022-10-12 NOTE — Discharge Instructions (Addendum)
  Post Anesthesia Home Care Instructions  Activity: Get plenty of rest for the remainder of the day. A responsible individual must stay with you for 24 hours following the procedure.  For the next 24 hours, DO NOT: -Drive a car -Paediatric nurse -Drink alcoholic beverages -Take any medication unless instructed by your physician -Make any legal decisions or sign important papers.  Meals: Start with liquid foods such as gelatin or soup. Progress to regular foods as tolerated. Avoid greasy, spicy, heavy foods. If nausea and/or vomiting occur, drink only clear liquids until the nausea and/or vomiting subsides. Call your physician if vomiting continues.  Special Instructions/Symptoms: Your throat may feel dry or sore from the anesthesia or the breathing tube placed in your throat during surgery. If this causes discomfort, gargle with warm salt water. The discomfort should disappear within 24 hours.  Ibuprofen can be taken after 4:36 pm if needed No tylenol until 2:40 p.m.

## 2022-10-12 NOTE — Op Note (Signed)
10/12/2022  10:16 AM  PATIENT:  Kathryn Duncan  47 y.o. female  PRE-OPERATIVE DIAGNOSIS:  RIGHT BREAST PAPILLOMA  POST-OPERATIVE DIAGNOSIS:  RIGHT BREAST PAPILLOMA  PROCEDURE:  Procedure(s): RIGHT BREAST LUMPECTOMY WITH RADIOACTIVE SEED LOCALIZATION (Right)  SURGEON:  Surgeon(s) and Role:    Jovita Kussmaul, MD - Primary  PHYSICIAN ASSISTANT:   ASSISTANTS: none   ANESTHESIA:   local and general  EBL:  10 mL   BLOOD ADMINISTERED:none  DRAINS: none   LOCAL MEDICATIONS USED:  MARCAINE     SPECIMEN:  Source of Specimen:  right breast tissue with additional inferior margin  DISPOSITION OF SPECIMEN:  PATHOLOGY  COUNTS:  YES  TOURNIQUET:  * No tourniquets in log *  DICTATION: .Dragon Dictation  After informed consent was obtained the patient was brought to the operating room and placed in the supine position on the operating table.  After adequate induction of general anesthesia the patient's right breast was prepped with ChloraPrep, allowed to dry, and draped in usual sterile manner.  An appropriate timeout was performed.  Previously an I-125 seed was placed in the upper outer subareolar right breast to mark an area of intraductal papilloma.  The neoprobe was set to I-125 in the area of radioactivity was readily identified.  The area around this was infiltrated with quarter percent Marcaine.  A curvilinear incision was made along the upper outer edge of the areola of the right breast with a 15 blade knife.  The incision was carried through the skin and subcutaneous tissue sharply with the electrocautery.  The dissection was then carried around the area of the radioactive seed sharply with the electrocautery while checking the area of radioactivity frequently.  Once the specimen was removed from the patient it was oriented with the appropriate paint colors.  A specimen radiograph was obtained that showed the seed but no clip within the specimen.  I took an additional inferior margin  and marked this appropriately.  A specimen radiograph of this showed the clip.  The 2 specimens were then sent to pathology for further evaluation.  Hemostasis was achieved using the Bovie electrocautery.  The wound was irrigated with saline and infiltrated with more quarter percent Marcaine.  The deep layer of the incision was closed with layers of interrupted 3-0 Vicryl stitches.  The skin was closed with interrupted 4-0 Monocryl subcuticular stitches.  Dermabond dressings were applied.  The patient tolerated the procedure well.  At the end of the case all needle sponge and instrument counts were correct.  The patient was then awakened and taken to recovery in stable condition.  PLAN OF CARE: Discharge to home after PACU  PATIENT DISPOSITION:  PACU - hemodynamically stable.   Delay start of Pharmacological VTE agent (>24hrs) due to surgical blood loss or risk of bleeding: not applicable

## 2022-10-12 NOTE — Anesthesia Postprocedure Evaluation (Signed)
Anesthesia Post Note  Patient: Kathryn Duncan  Procedure(s) Performed: RIGHT BREAST LUMPECTOMY WITH RADIOACTIVE SEED LOCALIZATION (Right: Breast)     Patient location during evaluation: PACU Anesthesia Type: General Level of consciousness: awake Pain management: pain level controlled Vital Signs Assessment: post-procedure vital signs reviewed and stable Respiratory status: spontaneous breathing, nonlabored ventilation and respiratory function stable Cardiovascular status: blood pressure returned to baseline and stable Postop Assessment: no apparent nausea or vomiting Anesthetic complications: no   No notable events documented.  Last Vitals:  Vitals:   10/12/22 1130 10/12/22 1145  BP: (!) 103/59 128/67  Pulse: 66 78  Resp: 14 14  Temp:  36.6 C  SpO2: 92% 99%    Last Pain:  Vitals:   10/12/22 1152  TempSrc:   PainSc: 2                  Tzipora Mcinroy P Cheron Pasquarelli

## 2022-10-12 NOTE — H&P (Signed)
REFERRING PHYSICIAN: Aundria Rud* PROVIDER: Landry Corporal, MD MRN: [id number] DOB: 1976/05/24 Subjective   Chief Complaint: New Consultation (Right Breast )  History of Present Illness: Kathryn Duncan is a 47 y.o. female who is seen today as an office consultation for evaluation of New Consultation (Right Breast )  We are asked to see the patient in consultation by Dr. Quincy Simmonds to evaluate her for a papilloma of the right breast. The patient is a 47 year old black female who has been experiencing some clear nipple discharge and pain in the right breast over the last 2 months. The discharge has been fairly constant. The hurting in the breast seems to come and go. She does have a family history of breast cancer in her mother twice. She is otherwise in good health and does not smoke. She was evaluated with mammogram and ultrasound and found to have a 6 mm mass in the subareolar outer right breast. This was biopsied and came back as an intraductal papilloma.  Review of Systems: A complete review of systems was obtained from the patient. I have reviewed this information and discussed as appropriate with the patient. See HPI as well for other ROS.  ROS   Medical History: Past Medical History:  Diagnosis Date  Arthritis  Hypertension   Patient Active Problem List  Diagnosis  Intraductal papilloma of right breast   Past Surgical History:  Procedure Laterality Date  LAPAROSCOPIC TUBAL LIGATION 2007  HYSTERECTOMY 04/03/2021  BLADDER SLING/CYSTOSCOPY  BREAST BIOSPY 08/28/2022    Allergies  Allergen Reactions  Cinnamon Itching  Clindamycin Other (See Comments)  Hydrocodone Itching  Oxycodone-Acetaminophen Itching  Penicillins Hives   Current Outpatient Medications on File Prior to Visit  Medication Sig Dispense Refill  amLODIPine (NORVASC) 10 MG tablet Take 10 mg by mouth once daily  ergocalciferol, vitamin D2, 1,250 mcg (50,000 unit) capsule Take 1 capsule by mouth once  a week   No current facility-administered medications on file prior to visit.   History reviewed. No pertinent family history.   Social History   Tobacco Use  Smoking Status Never  Smokeless Tobacco Never    Social History   Socioeconomic History  Marital status: Unknown  Tobacco Use  Smoking status: Never  Smokeless tobacco: Never  Substance and Sexual Activity  Alcohol use: Not Currently  Drug use: Never   Objective:   Vitals:  BP: 130/80  Pulse: 65  Temp: 36.6 C (97.9 F)  SpO2: 97%  Weight: (!) 118.4 kg (261 lb)  Height: 170.2 cm (5\' 7" )  PainSc: 5  PainLoc: Breast   Body mass index is 40.88 kg/m.  Physical Exam Vitals reviewed.  Constitutional:  General: She is not in acute distress. Appearance: Normal appearance.  HENT:  Head: Normocephalic and atraumatic.  Right Ear: External ear normal.  Left Ear: External ear normal.  Nose: Nose normal.  Mouth/Throat:  Mouth: Mucous membranes are moist.  Pharynx: Oropharynx is clear.  Eyes:  General: No scleral icterus. Extraocular Movements: Extraocular movements intact.  Conjunctiva/sclera: Conjunctivae normal.  Pupils: Pupils are equal, round, and reactive to light.  Cardiovascular:  Rate and Rhythm: Normal rate and regular rhythm.  Pulses: Normal pulses.  Heart sounds: Normal heart sounds.  Pulmonary:  Effort: Pulmonary effort is normal. No respiratory distress.  Breath sounds: Normal breath sounds.  Abdominal:  General: Bowel sounds are normal.  Palpations: Abdomen is soft.  Tenderness: There is no abdominal tenderness.  Musculoskeletal:  General: No swelling, tenderness or deformity. Normal range of  motion.  Cervical back: Normal range of motion and neck supple.  Skin: General: Skin is warm and dry.  Coloration: Skin is not jaundiced.  Neurological:  General: No focal deficit present.  Mental Status: She is alert and oriented to person, place, and time.  Psychiatric:  Mood and Affect: Mood  normal.  Behavior: Behavior normal.     Breast: There is no palpable mass in either breast. There is no palpable axillary, supraclavicular, or cervical lymphadenopathy.  Labs, Imaging and Diagnostic Testing:  Assessment and Plan:   Diagnoses and all orders for this visit:  Intraductal papilloma of right breast - [facility] Case Posting Request; Future   The patient appears to have a 6 mm intraductal papilloma in the subareolar outer right breast associated with nipple discharge. Because of the discharge and because of her family history of breast cancer she is in favor of having it removed which I feel is very reasonable. I have discussed with her in detail the risk and benefits of the operation as well as some of the technical aspects including the use of a radioactive seed for localization and she understands and wishes to proceed.

## 2022-10-12 NOTE — Interval H&P Note (Signed)
History and Physical Interval Note:  10/12/2022 9:18 AM  Kathryn Duncan  has presented today for surgery, with the diagnosis of RIGHT BREAST PAPILLOMA.  The various methods of treatment have been discussed with the patient and family. After consideration of risks, benefits and other options for treatment, the patient has consented to  Procedure(s): RIGHT BREAST LUMPECTOMY WITH RADIOACTIVE SEED LOCALIZATION (Right) as a surgical intervention.  The patient's history has been reviewed, patient examined, no change in status, stable for surgery.  I have reviewed the patient's chart and labs.  Questions were answered to the patient's satisfaction.     Autumn Messing III

## 2022-10-12 NOTE — Anesthesia Procedure Notes (Signed)
Procedure Name: LMA Insertion Date/Time: 10/12/2022 9:49 AM  Performed by: Tawni Millers, CRNAPre-anesthesia Checklist: Patient identified, Emergency Drugs available, Suction available and Patient being monitored Patient Re-evaluated:Patient Re-evaluated prior to induction Oxygen Delivery Method: Circle system utilized Preoxygenation: Pre-oxygenation with 100% oxygen Induction Type: IV induction Ventilation: Mask ventilation without difficulty LMA: LMA inserted LMA Size: 4.0 Number of attempts: 1 Airway Equipment and Method: Bite block Placement Confirmation: positive ETCO2 Tube secured with: Tape Dental Injury: Teeth and Oropharynx as per pre-operative assessment

## 2022-10-12 NOTE — Anesthesia Preprocedure Evaluation (Addendum)
Anesthesia Evaluation  Patient identified by MRN, date of birth, ID band Patient awake    Reviewed: Allergy & Precautions, NPO status , Patient's Chart, lab work & pertinent test results  Airway Mallampati: III  TM Distance: >3 FB Neck ROM: Full    Dental no notable dental hx.    Pulmonary neg pulmonary ROS   Pulmonary exam normal        Cardiovascular hypertension, Pt. on medications Normal cardiovascular exam     Neuro/Psych negative neurological ROS  negative psych ROS   GI/Hepatic negative GI ROS, Neg liver ROS,,,  Endo/Other    Morbid obesity  Renal/GU negative Renal ROS     Musculoskeletal negative musculoskeletal ROS (+)    Abdominal  (+) + obese  Peds  Hematology negative hematology ROS (+)   Anesthesia Other Findings RIGHT BREAST PAPILLOMA  Reproductive/Obstetrics S/p BTL and hysterectomy                              Anesthesia Physical Anesthesia Plan  ASA: 3  Anesthesia Plan: General   Post-op Pain Management:    Induction: Intravenous  PONV Risk Score and Plan: 3 and Ondansetron, Dexamethasone, Midazolam and Treatment may vary due to age or medical condition  Airway Management Planned: LMA  Additional Equipment:   Intra-op Plan:   Post-operative Plan: Extubation in OR  Informed Consent: I have reviewed the patients History and Physical, chart, labs and discussed the procedure including the risks, benefits and alternatives for the proposed anesthesia with the patient or authorized representative who has indicated his/her understanding and acceptance.     Dental advisory given  Plan Discussed with: CRNA  Anesthesia Plan Comments:        Anesthesia Quick Evaluation

## 2022-10-12 NOTE — Transfer of Care (Signed)
Immediate Anesthesia Transfer of Care Note  Patient: Aryiana Degler  Procedure(s) Performed: RIGHT BREAST LUMPECTOMY WITH RADIOACTIVE SEED LOCALIZATION (Right: Breast)  Patient Location: PACU  Anesthesia Type:General  Level of Consciousness: awake  Airway & Oxygen Therapy: Patient Spontanous Breathing and Patient connected to face mask oxygen  Post-op Assessment: Report given to RN and Post -op Vital signs reviewed and stable  Post vital signs: Reviewed and stable  Last Vitals:  Vitals Value Taken Time  BP    Temp    Pulse 70 10/12/22 1024  Resp    SpO2 99 % 10/12/22 1024  Vitals shown include unvalidated device data.  Last Pain:  Vitals:   10/12/22 0826  TempSrc: Oral  PainSc: 0-No pain      Patients Stated Pain Goal: 3 [phone])  Complications: No notable events documented.

## 2022-10-15 ENCOUNTER — Encounter (HOSPITAL_BASED_OUTPATIENT_CLINIC_OR_DEPARTMENT_OTHER): Payer: Self-pay | Admitting: General Surgery

## 2022-10-15 LAB — SURGICAL PATHOLOGY

## 2022-10-25 ENCOUNTER — Encounter (HOSPITAL_COMMUNITY): Payer: Self-pay

## 2022-12-13 NOTE — Progress Notes (Signed)
47 y.o. G80P0 Married [place] American female here for annual exam.    Has had 2 UTIs.  Going to urgent care.   Some vaginal dryness.   She had excision of a right breast papilloma in March.  Final pathology benign. She has no further nipple discharge.  Will see PCP this month.  PCP:   [facility] in [place], [place]  Patient's last menstrual period was 03/15/2021 (approximate).           Sexually active: Yes.    The current method of family planning is status post hysterectomy.    Exercising:    Smoker:  no  Health Maintenance: Pap:  08/19/20 neg: HR HPV neg History of abnormal Pap:  yes, hx of colpo but no treatment MMG:  08/22/22 R breast Breast Density Cat B, BI-RADS CAT 4 suspicious, 12/25/21 BI-RADS CAT 1 neg.  She will schedule. Colonoscopy:  n/a BMD:   n/a  Result  n/a TDaP:  n/a Gardasil:   no HIV: neg in past Hep C: neg Screening Labs:  PCP   reports that she has never smoked. She has never used smokeless tobacco. She reports that she does not currently use alcohol. She reports that she does not use drugs.  Past Medical History:  Diagnosis Date   Cervical herniated disc    left C 6 - C7 reports pinched nerve also on right side   COVID 07/2019   headache n/v diarrhea, loss of taste and smell all symptoms resolved   Elevated hemoglobin A1c 03/2021   6.1   Hypertension    Superficial thrombosis of leg, right 2007   from birth control pills   Vitamin D deficiency 03/29/2021   Wears contact lenses 03/29/2021   Wears glasses 03/29/2021    Past Surgical History:  Procedure Laterality Date   BLADDER SUSPENSION N/A 04/03/2021   Procedure: TRANSVAGINAL TAPE (TVT) PROCEDURE;  Surgeon: Patton Salles, MD;  Location: [facility];  Service: Gynecology;  Laterality: N/A;   BREAST BIOPSY Right 08/28/2022   [place] RT BREAST BX W LOC DEV 1ST LESION IMG BX SPEC US GUIDE 08/28/2022 [facility]   BREAST BIOPSY  10/11/2022   MM RT RADIOACTIVE SEED LOC  MAMMO GUIDE 10/11/2022 [facility]   BREAST LUMPECTOMY WITH RADIOACTIVE SEED LOCALIZATION Right 10/12/2022   Procedure: RIGHT BREAST LUMPECTOMY WITH RADIOACTIVE SEED LOCALIZATION;  Surgeon: Griselda Miner, MD;  Location: [facility];  Service: General;  Laterality: Right;   CYSTOSCOPY N/A 04/03/2021   Procedure: CYSTOSCOPY;  Surgeon: Patton Salles, MD;  Location: [facility];  Service: Gynecology;  Laterality: N/A;   TOTAL LAPAROSCOPIC HYSTERECTOMY WITH SALPINGECTOMY Bilateral 04/03/2021   Procedure: TOTAL LAPAROSCOPIC HYSTERECTOMY WITH SALPINGECTOMY;  Surgeon: Patton Salles, MD;  Location: [facility];  Service: Gynecology;  Laterality: Bilateral;   TUBAL LIGATION     18 to 17 yrs ago per pt on 03-29-2021    Current Outpatient Medications  Medication Sig Dispense Refill   losartan (COZAAR) 100 MG tablet Take 100 mg by mouth daily.     Vitamin D, Ergocalciferol, (DRISDOL) 1.25 MG (50000 UNIT) CAPS capsule Take 50,000 Units by mouth once a week.     No current facility-administered medications for this visit.    Family History  Problem Relation Age of Onset   Breast cancer Mother 29       diagnosed again at age 32   Diabetes Mother  Hypertension Mother    Hyperlipidemia Mother    Hypertension Father    Hyperlipidemia Father    Diabetes Father    Heart disease Father    Rheum arthritis Father    Hypertension Sister    Breast cancer Paternal Aunt     Review of Systems  All other systems reviewed and are negative.   Exam:   BP 116/68 (BP Location: Right Arm, Patient Position: Sitting, Cuff Size: Large)   Pulse 75   Ht 5\' 5"  (1.651 m)   Wt 269 lb (122 kg)   LMP 03/15/2021 (Approximate)   SpO2 97%   BMI 44.76 kg/m     General appearance: alert, cooperative and appears stated age Head: normocephalic, without obvious abnormality, atraumatic Neck: no adenopathy, supple, symmetrical, trachea midline and  thyroid normal to inspection and palpation Lungs: clear to auscultation bilaterally Breasts: normal appearance, no masses or tenderness, No nipple retraction or dimpling, No nipple discharge or bleeding, No axillary adenopathy Heart: regular rate and rhythm Abdomen: soft, non-tender; no masses, no organomegaly Extremities: extremities normal, atraumatic, no cyanosis or edema Skin: skin color, texture, turgor normal. No rashes or lesions Lymph nodes: cervical, supraclavicular, and axillary nodes normal. Neurologic: grossly normal  Pelvic: External genitalia:  no lesions              No abnormal inguinal nodes palpated.              Urethra:  normal appearing urethra with no masses, tenderness or lesions              Bartholins and Skenes: normal                 Vagina: normal appearing vagina with normal color and discharge, no lesions              Cervix:                Pap taken: no Bimanual Exam:  Uterus:  absent              Adnexa: no mass, fullness, tenderness              Rectal exam: yes.  Confirms.              Anus:  normal sphincter tone, no lesions  Chaperone was present for exam:  Warren Lacy, CMA  Assessment:   Well woman visit with gynecologic exam. Status post right lumpectomy for intraductal papilloma. Status post total laparoscopic hysterectomy with bilateral salpingectomy, TVT Exact midurethral sling, cystoscopy. Recent UTIs. FH breast cancer.  Hx superficial thrombus right leg. Vaginal atrophy. Hx elevated A1C.  FH breast cancer.  Colon cancer screening.  Plan: Mammogram screening discussed. Self breast awareness reviewed. Pap and HR HPV as above. Guidelines for Calcium, Vitamin D, regular exercise program including cardiovascular and weight bearing exercise. E discussed OTC D Mannose and possible Rx for post coital abx if she develops a pattern of UTIs related to sex.  Lubricants reviewed.  Referral for colonoscopy.  Follow up annually and prn.   After  visit summary provided.

## 2022-12-27 ENCOUNTER — Encounter: Payer: Self-pay | Admitting: Obstetrics and Gynecology

## 2022-12-27 ENCOUNTER — Ambulatory Visit: Payer: BC Managed Care – PPO | Admitting: Obstetrics and Gynecology

## 2022-12-27 VITALS — BP 116/68 | HR 75 | Ht 65.0 in | Wt 269.0 lb

## 2022-12-27 DIAGNOSIS — N952 Postmenopausal atrophic vaginitis: Secondary | ICD-10-CM

## 2022-12-27 DIAGNOSIS — Z01419 Encounter for gynecological examination (general) (routine) without abnormal findings: Secondary | ICD-10-CM | POA: Diagnosis not present

## 2022-12-27 DIAGNOSIS — Z1211 Encounter for screening for malignant neoplasm of colon: Secondary | ICD-10-CM

## 2022-12-27 MED ORDER — ESTRADIOL 0.1 MG/GM VA CREA: TOPICAL_CREAM | VAGINAL | 1 refills | Status: DC

## 2022-12-27 NOTE — Patient Instructions (Signed)
Try D Mannose for UTI prevention.   If you develop post coital UTIs, I can prescribe post coital antibiotic use for bladder infection prevention.   EXERCISE AND DIET:  We recommended that you start or continue a regular exercise program for good health. Regular exercise means any activity that makes your heart beat faster and makes you sweat.  We recommend exercising at least 30 minutes per day at least 3 days a week, preferably 4 or 5.  We also recommend a diet low in fat and sugar.  Inactivity, poor dietary choices and obesity can cause diabetes, heart attack, stroke, and kidney damage, among others.    ALCOHOL AND SMOKING:  Women should limit their alcohol intake to no more than 7 drinks/beers/glasses of wine (combined, not each!) per week. Moderation of alcohol intake to this level decreases your risk of breast cancer and liver damage. And of course, no recreational drugs are part of a healthy lifestyle.  And absolutely no smoking or even second hand smoke. Most people know smoking can cause heart and lung diseases, but did you know it also contributes to weakening of your bones? Aging of your skin?  Yellowing of your teeth and nails?  CALCIUM AND VITAMIN D:  Adequate intake of calcium and Vitamin D are recommended.  The recommendations for exact amounts of these supplements seem to change often, but generally speaking 600 mg of calcium (either carbonate or citrate) and 800 units of Vitamin D per day seems prudent. Certain women may benefit from higher intake of Vitamin D.  If you are among these women, your doctor will have told you during your visit.    PAP SMEARS:  Pap smears, to check for cervical cancer or precancers,  have traditionally been done yearly, although recent scientific advances have shown that most women can have pap smears less often.  However, every woman still should have a physical exam from her gynecologist every year. It will include a breast check, inspection of the vulva and  vagina to check for abnormal growths or skin changes, a visual exam of the cervix, and then an exam to evaluate the size and shape of the uterus and ovaries.  And after 45 years of age, a rectal exam is indicated to check for rectal cancers. We will also provide age appropriate advice regarding health maintenance, like when you should have certain vaccines, screening for sexually transmitted diseases, bone density testing, colonoscopy, mammograms, etc.   MAMMOGRAMS:  All women over 45 years old should have a yearly mammogram. Many facilities now offer a "3D" mammogram, which may cost around $50 extra out of pocket. If possible,  we recommend you accept the option to have the 3D mammogram performed.  It both reduces the number of women who will be called back for extra views which then turn out to be normal, and it is better than the routine mammogram at detecting truly abnormal areas.    COLONOSCOPY:  Colonoscopy to screen for colon cancer is recommended for all women at age 45.  We know, you hate the idea of the prep.  We agree, BUT, having colon cancer and not knowing it is worse!!  Colon cancer so often starts as a polyp that can be seen and removed at colonscopy, which can quite literally save your life!  And if your first colonoscopy is normal and you have no family history of colon cancer, most women don't have to have it again for 10 years.  Once every ten years,  you can do something that may end up saving your life, right?  We will be happy to help you get it scheduled when you are ready.  Be sure to check your insurance coverage so you understand how much it will cost.  It may be covered as a preventative service at no cost, but you should check your particular policy.

## 2023-01-21 ENCOUNTER — Other Ambulatory Visit: Payer: Self-pay | Admitting: Obstetrics and Gynecology

## 2023-01-21 DIAGNOSIS — Z1231 Encounter for screening mammogram for malignant neoplasm of breast: Secondary | ICD-10-CM

## 2023-01-28 ENCOUNTER — Ambulatory Visit
Admission: RE | Admit: 2023-01-28 | Discharge: 2023-01-28 | Disposition: A | Discharge status: Home / Self Care | Payer: BC Managed Care – PPO | Source: Ambulatory Visit | Attending: Obstetrics and Gynecology | Admitting: Obstetrics and Gynecology

## 2023-01-28 DIAGNOSIS — Z1231 Encounter for screening mammogram for malignant neoplasm of breast: Secondary | ICD-10-CM

## 2023-06-12 ENCOUNTER — Ambulatory Visit: Payer: BC Managed Care – PPO | Admitting: Podiatry

## 2023-12-30 ENCOUNTER — Encounter: Payer: Self-pay | Admitting: Obstetrics and Gynecology

## 2023-12-30 ENCOUNTER — Ambulatory Visit (INDEPENDENT_AMBULATORY_CARE_PROVIDER_SITE_OTHER): Admitting: Obstetrics and Gynecology

## 2023-12-30 VITALS — BP 104/70 | HR 68 | Ht 66.25 in | Wt 261.0 lb

## 2023-12-30 DIAGNOSIS — N951 Menopausal and female climacteric states: Secondary | ICD-10-CM

## 2023-12-30 DIAGNOSIS — Z1331 Encounter for screening for depression: Secondary | ICD-10-CM | POA: Diagnosis not present

## 2023-12-30 DIAGNOSIS — Z01419 Encounter for gynecological examination (general) (routine) without abnormal findings: Secondary | ICD-10-CM

## 2023-12-30 MED ORDER — GABAPENTIN 100 MG PO CAPS: ORAL_CAPSULE | ORAL | 3 refills | Status: DC

## 2023-12-30 NOTE — Patient Instructions (Signed)

## 2023-12-30 NOTE — Progress Notes (Signed)
 48 y.o. [id number] female s/p hysterectomy (AUB-F, pain), TVT Exact midurethral sling (2022) here for annual exam. Married. OB RN, working on [employer], planning for [employer]  Patient's last menstrual period was 03/15/2021 (approximate).   She reports occasional breast pain in both breast and hot flashes. Feels that it may be hormonal. No discharge or lumps. Biopsies of right breast in 2024.  Intraductal papilloma, benign- s/p right lumpectomy with Dr. Alethea Andes, discharged. Pt's mother was dx'd with breast cancer at 53yo (again at 48yo).  Hx of postpartum superficial DVT (18y ago), recommendations at the time were against COC and estrogen.  Abnormal bleeding: none Pelvic discharge or pain: none Breast mass, nipple discharge or skin changes : as noted  Sexually active: no  History of abnormal Pap:  Yes, Hx of colposcopy in the past, but no treatment.  Last PAP:     Component Value Date/Time   DIAGPAP  08/19/2020 1303    - Negative for intraepithelial lesion or malignancy (NILM)   HPVHIGH Negative 08/19/2020 1303   ADEQPAP  08/19/2020 1303    Satisfactory for evaluation; transformation zone component PRESENT.   Last mammogram: 01/28/2023 BI-RADS 1, density B Last colonoscopy: 06/2023 positive polyps, every 2 year  Exercising: Yes. Walk on walk pad for 30 mins maybe 2 days a week.  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 in [facility]  PHQ-2 Total Score 0         GYN HISTORY: Prior hysterectomy Prior right lumpectomy, papilloma  OB History  Gravida Para Term Preterm AB Living  4 4 3 1  4   SAB IAB Ectopic Multiple Live Births      4    # Outcome Date GA Lbr Len/2nd Weight Sex Type Anes PTL Lv  4 Preterm      Vag-Spont   LIV  3 Term      Vag-Spont   LIV  2 Term      Vag-Spont   LIV  1 Term      Vag-Spont   LIV   Past Medical History:  Diagnosis Date   Cervical herniated disc    left C 6 - C7 reports pinched nerve also on right  side   COVID 07/2019   headache n/v diarrhea, loss of taste and smell all symptoms resolved   Elevated hemoglobin A1c 03/2021   6.1   Hypertension    Orbital cellulitis, bilateral    Superficial thrombosis of leg, right 2007   from birth control pills   Vitamin D deficiency 03/29/2021   Wears contact lenses 03/29/2021   Wears glasses 03/29/2021   Past Surgical History:  Procedure Laterality Date   BLADDER SUSPENSION N/A 04/03/2021   Procedure: TRANSVAGINAL TAPE (TVT) PROCEDURE;  Surgeon: Greta Leatherwood, MD;  Location: [facility];  Service: Gynecology;  Laterality: N/A;   BREAST BIOPSY Right 08/28/2022   US  RT BREAST BX W LOC DEV 1ST LESION IMG BX SPEC US  GUIDE 08/28/2022 [facility]MO[facility]   BREAST BIOPSY  10/11/2022   MM RT RADIOACTIVE SEED LOC MAMMO GUIDE 10/11/2022 [facility]MO[facility]   BREAST EXCISIONAL BIOPSY Right 09/2022   BREAST LUMPECTOMY WITH RADIOACTIVE SEED LOCALIZATION Right 10/12/2022   Procedure: RIGHT BREAST LUMPECTOMY WITH RADIOACTIVE SEED LOCALIZATION;  Surgeon: Caralyn Chandler, MD;  Location: [facility];  Service: General;  Laterality: Right;   CYSTOSCOPY N/A 04/03/2021   Procedure: CYSTOSCOPY;  Surgeon: Greta Leatherwood, MD;  Location: [facility];  Service: Gynecology;  Laterality: N/A;   TOTAL LAPAROSCOPIC HYSTERECTOMY WITH SALPINGECTOMY Bilateral 04/03/2021   Procedure: TOTAL LAPAROSCOPIC HYSTERECTOMY WITH SALPINGECTOMY;  Surgeon: Greta Leatherwood, MD;  Location: [facility];  Service: Gynecology;  Laterality: Bilateral;   TUBAL LIGATION     16 to 17 yrs ago per pt on 03-29-2021   Current Outpatient Medications on File Prior to Visit  Medication Sig Dispense Refill   losartan (COZAAR) 100 MG tablet Take 100 mg by mouth daily.     meloxicam (MOBIC) 15 MG tablet Take 15 mg by mouth daily.     Semaglutide-Weight Management 0.5 MG/0.5ML SOAJ Inject into the skin.     Vitamin D,  Ergocalciferol, (DRISDOL) 1.25 MG (50000 UNIT) CAPS capsule Take 50,000 Units by mouth once a week.     No current facility-administered medications on file prior to visit.   Social History   Socioeconomic History   Marital status: Married    Spouse name: Not on file   Number of children: Not on file   Years of education: Not on file   Highest education level: Not on file  Occupational History   Not on file  Tobacco Use   Smoking status: Never   Smokeless tobacco: Never  Vaping Use   Vaping status: Never Used  Substance and Sexual Activity   Alcohol use: Not Currently   Drug use: Never   Sexual activity: Yes    Birth control/protection: Surgical    Comment: BTL, hysterectomy (not since surgery)  Other Topics Concern   Not on file  Social History Narrative   Not on file   Social Drivers of Health   Financial Resource Strain: Not on file  Food Insecurity: Not on file  Transportation Needs: Not on file  Physical Activity: Not on file  Stress: Not on file  Social Connections: Not on file  Intimate Partner Violence: Not on file   Family History  Problem Relation Age of Onset   Breast cancer Mother 29       diagnosed again at age 58   Diabetes Mother    Hypertension Mother    Hyperlipidemia Mother    Hypertension Father    Hyperlipidemia Father    Diabetes Father    Heart disease Father    Rheum arthritis Father    Hypertension Sister    Breast cancer Paternal Aunt    Allergies  Allergen Reactions   Cinnamon Itching   Clindamycin/Lincomycin Itching   Hydrocodone  Itching   Penicillins Hives     PE Today's Vitals   12/30/23 1141  BP: 104/70  Pulse: 68  SpO2: 98%  Weight: 261 lb (118.4 kg)  Height: 5' 6.25" (1.683 m)   Body mass index is 41.81 kg/m.  Physical Exam Vitals reviewed. Exam conducted with a chaperone present.  Constitutional:      General: She is not in acute distress.    Appearance: Normal appearance.  HENT:     Head: Normocephalic  and atraumatic.     Nose: Nose normal.  Eyes:     Extraocular Movements: Extraocular movements intact.     Conjunctiva/sclera: Conjunctivae normal.  Neck:     Thyroid: No thyroid mass, thyromegaly or thyroid tenderness.  Pulmonary:     Effort: Pulmonary effort is normal.  Chest:     Chest wall: No mass or tenderness.  Breasts:    Right: Normal. No swelling, mass, nipple discharge or tenderness.  Left: Normal. No swelling, mass, nipple discharge or tenderness.       Comments: Prior lumpectomy scar Abdominal:     General: There is no distension.     Palpations: Abdomen is soft.     Tenderness: There is no abdominal tenderness.  Genitourinary:    General: Normal vulva.     Exam position: Lithotomy position.     Urethra: No prolapse.     Vagina: Normal. No vaginal discharge or bleeding.     Cervix: No lesion.     Adnexa: Right adnexa normal and left adnexa normal.     Comments: Cervix and uterus absent Musculoskeletal:        General: Normal range of motion.     Cervical back: Normal range of motion.  Lymphadenopathy:     Upper Body:     Right upper body: No axillary adenopathy.     Left upper body: No axillary adenopathy.     Lower Body: No right inguinal adenopathy. No left inguinal adenopathy.  Skin:    General: Skin is warm and dry.  Neurological:     General: No focal deficit present.     Mental Status: She is alert.  Psychiatric:        Mood and Affect: Mood normal.        Behavior: Behavior normal.       Assessment and Plan:        Well woman exam with routine gynecological exam Assessment & Plan: Cervical cancer screening performed according to ASCCP guidelines. Encouraged annual mammogram screening Colonoscopy UTD DXA N/A Labs and immunizations with her primary Encouraged safe sexual practices as indicated Encouraged healthy lifestyle practices with diet and exercise For patients under 50yo, I recommend 1000mg  calcium daily and 600IU of vitamin D  daily.    Negative depression screening  Perimenopausal vasomotor symptoms Assessment & Plan: Discussed nonhormonal options for VMS, including SSRIs, veozah, gabapetin- given hx of superficial postpartum lower extremity DVT. Interested in gabapentin - side effects include mood irritability, sexual dysfunction, and drowsiness. Suicidal ideation can happpen in a subset of patient.    Other orders -     Gabapentin ; Take 1 capsule (100 mg total) by mouth at bedtime for 3 days, THEN 2 capsules (200 mg total) at bedtime for 3 days, THEN 3 capsules (300 mg total) at bedtime.  Dispense: 270 capsule; Refill: 3   Romaine Closs, MD

## 2023-12-30 NOTE — Assessment & Plan Note (Signed)
 Discussed nonhormonal options for VMS, including SSRIs, veozah, gabapetin- given hx of superficial postpartum lower extremity DVT. Interested in gabapentin - side effects include mood irritability, sexual dysfunction, and drowsiness. Suicidal ideation can happpen in a subset of patient.

## 2023-12-30 NOTE — Assessment & Plan Note (Signed)
 Cervical cancer screening performed according to ASCCP guidelines. Encouraged annual mammogram screening Colonoscopy UTD DXA N/A Labs and immunizations with her primary Encouraged safe sexual practices as indicated Encouraged healthy lifestyle practices with diet and exercise For patients under 46yo, I recommend 1000mg  calcium daily and 600IU of vitamin D daily.

## 2024-01-08 ENCOUNTER — Other Ambulatory Visit: Payer: Self-pay | Admitting: Obstetrics and Gynecology

## 2024-01-08 DIAGNOSIS — Z1231 Encounter for screening mammogram for malignant neoplasm of breast: Secondary | ICD-10-CM

## 2024-02-03 ENCOUNTER — Ambulatory Visit
Admission: RE | Admit: 2024-02-03 | Discharge: 2024-02-03 | Disposition: A | Discharge status: Home / Self Care | Source: Ambulatory Visit | Attending: Obstetrics and Gynecology | Admitting: Obstetrics and Gynecology

## 2024-02-03 DIAGNOSIS — Z1231 Encounter for screening mammogram for malignant neoplasm of breast: Secondary | ICD-10-CM

## 2024-02-05 ENCOUNTER — Ambulatory Visit: Payer: Self-pay | Admitting: Obstetrics and Gynecology

## 2024-05-16 ENCOUNTER — Encounter: Payer: Self-pay | Admitting: Obstetrics and Gynecology

## 2024-12-31 ENCOUNTER — Ambulatory Visit: Admitting: Obstetrics and Gynecology
